# Patient Record
Sex: Female | Born: 1950 | Race: White | Hispanic: No | Smoking: Never smoker
Health system: Southern US, Community
[De-identification: ages and names within clinical notes are randomized; demographics above are authoritative.]

## PROBLEM LIST (undated history)

## (undated) DIAGNOSIS — Z9889 Other specified postprocedural states: Secondary | ICD-10-CM

## (undated) DIAGNOSIS — F32A Depression, unspecified: Secondary | ICD-10-CM

## (undated) DIAGNOSIS — M1990 Unspecified osteoarthritis, unspecified site: Secondary | ICD-10-CM

## (undated) DIAGNOSIS — Z87898 Personal history of other specified conditions: Secondary | ICD-10-CM

## (undated) DIAGNOSIS — I10 Essential (primary) hypertension: Secondary | ICD-10-CM

## (undated) DIAGNOSIS — R112 Nausea with vomiting, unspecified: Secondary | ICD-10-CM

## (undated) DIAGNOSIS — F329 Major depressive disorder, single episode, unspecified: Secondary | ICD-10-CM

## (undated) DIAGNOSIS — K219 Gastro-esophageal reflux disease without esophagitis: Secondary | ICD-10-CM

## (undated) DIAGNOSIS — E785 Hyperlipidemia, unspecified: Secondary | ICD-10-CM

## (undated) DIAGNOSIS — M48062 Spinal stenosis, lumbar region with neurogenic claudication: Secondary | ICD-10-CM

## (undated) DIAGNOSIS — F419 Anxiety disorder, unspecified: Secondary | ICD-10-CM

## (undated) HISTORY — PX: BREAST LUMPECTOMY: SHX2

## (undated) HISTORY — PX: MANDIBLE SURGERY: SHX707

## (undated) HISTORY — PX: TRIGGER FINGER RELEASE: SHX641

## (undated) HISTORY — PX: OTHER SURGICAL HISTORY: SHX169

## (undated) HISTORY — PX: OOPHORECTOMY: SHX86

## (undated) HISTORY — PX: KNEE SURGERY: SHX244

## (undated) HISTORY — PX: BREAST BIOPSY: SHX20

## (undated) HISTORY — PX: ABDOMINAL HYSTERECTOMY: SHX81

---

## 1974-03-19 HISTORY — PX: KNEE SURGERY: SHX244

## 1988-03-19 HISTORY — PX: BREAST BIOPSY: SHX20

## 2004-06-01 ENCOUNTER — Ambulatory Visit: Payer: Self-pay | Admitting: Unknown Physician Specialty

## 2004-11-13 ENCOUNTER — Ambulatory Visit: Payer: Self-pay

## 2004-12-04 ENCOUNTER — Ambulatory Visit: Payer: Self-pay | Admitting: Oncology

## 2005-06-07 ENCOUNTER — Ambulatory Visit: Payer: Self-pay | Admitting: Unknown Physician Specialty

## 2006-06-11 ENCOUNTER — Ambulatory Visit: Payer: Self-pay | Admitting: Unknown Physician Specialty

## 2007-02-12 ENCOUNTER — Ambulatory Visit: Payer: Self-pay | Admitting: Unknown Physician Specialty

## 2007-06-17 ENCOUNTER — Ambulatory Visit: Payer: Self-pay | Admitting: Unknown Physician Specialty

## 2008-07-15 ENCOUNTER — Ambulatory Visit: Payer: Self-pay | Admitting: Unknown Physician Specialty

## 2009-07-20 ENCOUNTER — Ambulatory Visit: Payer: Self-pay | Admitting: Unknown Physician Specialty

## 2010-11-24 ENCOUNTER — Ambulatory Visit: Payer: Self-pay | Admitting: Unknown Physician Specialty

## 2012-04-04 ENCOUNTER — Ambulatory Visit: Payer: Self-pay | Admitting: Unknown Physician Specialty

## 2012-05-12 ENCOUNTER — Other Ambulatory Visit: Payer: Self-pay | Admitting: Neurological Surgery

## 2012-05-16 ENCOUNTER — Encounter (HOSPITAL_COMMUNITY)
Admission: RE | Admit: 2012-05-16 | Discharge: 2012-05-16 | Disposition: A | Discharge status: Home / Self Care | Payer: BC Managed Care – PPO | Source: Ambulatory Visit | Attending: Anesthesiology | Admitting: Anesthesiology

## 2012-05-16 ENCOUNTER — Encounter (HOSPITAL_COMMUNITY)
Admission: RE | Admit: 2012-05-16 | Discharge: 2012-05-16 | Disposition: A | Discharge status: Home / Self Care | Payer: BC Managed Care – PPO | Source: Ambulatory Visit | Attending: Neurological Surgery | Admitting: Neurological Surgery

## 2012-05-16 ENCOUNTER — Encounter (HOSPITAL_COMMUNITY): Payer: Self-pay

## 2012-05-16 HISTORY — DX: Other specified postprocedural states: Z98.890

## 2012-05-16 HISTORY — DX: Hyperlipidemia, unspecified: E78.5

## 2012-05-16 HISTORY — DX: Other specified postprocedural states: R11.2

## 2012-05-16 HISTORY — DX: Unspecified osteoarthritis, unspecified site: M19.90

## 2012-05-16 HISTORY — DX: Major depressive disorder, single episode, unspecified: F32.9

## 2012-05-16 HISTORY — DX: Essential (primary) hypertension: I10

## 2012-05-16 HISTORY — DX: Personal history of other specified conditions: Z87.898

## 2012-05-16 HISTORY — DX: Depression, unspecified: F32.A

## 2012-05-16 LAB — CBC
Platelets: 185 10*3/uL (ref 150–400)
RBC: 4.75 MIL/uL (ref 3.87–5.11)
RDW: 13.1 % (ref 11.5–15.5)
WBC: 6.5 10*3/uL (ref 4.0–10.5)

## 2012-05-16 LAB — BASIC METABOLIC PANEL
Calcium: 9.6 mg/dL (ref 8.4–10.5)
Creatinine, Ser: 0.65 mg/dL (ref 0.50–1.10)
GFR calc Af Amer: >90 mL/min (ref >90)
GFR calc non Af Amer: >90 mL/min (ref >90)
Sodium: 140 mEq/L (ref 135–145)

## 2012-05-16 LAB — SURGICAL PCR SCREEN
MRSA, PCR: NEGATIVE
Staphylococcus aureus: NEGATIVE

## 2012-05-16 NOTE — Progress Notes (Signed)
Primary Physician - Dr. Bayard Males [facility] in [place] Does not have a cardiologist No recent cardiac testing

## 2012-05-16 NOTE — Pre-Procedure Instructions (Signed)
Tina Petty  05/16/2012   Your procedure is scheduled on:  Tuesday, March 4th  Report to [facility] at 0630 AM per dr. Verlee Rossetti office.  Call this number if you have problems the morning of surgery: [phone]   Remember:   Do not eat food or drink liquids after midnight.    Take these medicines the morning of surgery with A SIP OF WATER:    Do not wear jewelry, make-up or nail polish.  Do not wear lotions, powders, or perfumes,deodorant.  Do not shave 48 hours prior to surgery. Men may shave face and neck.  Do not bring valuables to the hospital.  Contacts, dentures or bridgework may not be worn into surgery.  Leave suitcase in the car. After surgery it may be brought to your room.  For patients admitted to the hospital, checkout time is 11:00 AM the day of discharge.   Patients discharged the day of surgery will not be allowed to drive home.   Special Instructions: Shower using CHG 2 nights before surgery and the night before surgery.  If you shower the day of surgery use CHG.  Use special wash - you have one bottle of CHG for all showers.  You should use approximately 1/3 of the bottle for each shower.   Please read over the following fact sheets that you were given: Pain Booklet, Coughing and Deep Breathing, MRSA Information and Surgical Site Infection Prevention

## 2012-05-19 MED ORDER — CEFAZOLIN SODIUM-DEXTROSE 2-3 GM-% IV SOLR: 2.0000 g | INTRAVENOUS | Status: DC

## 2012-05-20 ENCOUNTER — Encounter (HOSPITAL_COMMUNITY): Admission: RE | Payer: Self-pay | Source: Ambulatory Visit

## 2012-05-20 ENCOUNTER — Inpatient Hospital Stay (HOSPITAL_COMMUNITY)
Admission: RE | Admit: 2012-05-20 | Payer: BC Managed Care – PPO | Source: Ambulatory Visit | Admitting: Neurological Surgery

## 2012-05-20 ENCOUNTER — Other Ambulatory Visit: Payer: Self-pay | Admitting: Neurological Surgery

## 2012-05-20 SURGERY — LUMBAR LAMINECTOMY/DECOMPRESSION MICRODISCECTOMY 3 LEVELS
Anesthesia: General | Laterality: Bilateral

## 2012-05-20 NOTE — H&P (Addendum)
CHIEF COMPLAINT:    Pain, numbness and weakness in the lower extremities.    HISTORY OF PRESENT ILLNESS:  Tina Petty is a 62 year old individual who has been seen in this office in the past by Dr. Shirlean Kelly.  At that time, she was evaluated for lumbar spinal stenosis but she seemed to be getting by with conservative management.  She was advised regarding a possible need for an MRI scan.  Tina Petty tells me that over time she has had progressively worsening symptoms of numbness, dysesthesias and pain into the lower extremities when she would walk any distance.  She notes that it involves both lower extremities but the right seems to be more involved.  She has been seen by Dr. Yves Dill and ultimately she underwent an MRI of her lumbar spine that was performed at [facility] [place].  This study was done on 03/04/2012.  The studies are reviewed in the office and demonstrate that the patient has evidence of significant spondylitic stenosis at L2-3, 3-4, and 4-5.  The singular worst level appears to be at L4-5 where there is severe stenosis centrally and in the lateral recesses involving both the L5 nerve roots.  At L3-4, there is severe stenosis centrally also with marked hypertrophy of the facet joints.  The lateral recesses appear to be more moderately involved.  At L2-3, there is moderately severe central canal and lateral recess stenosis again with hypertrophy of the facet joints.  The patient notes she has had little in the way of back pain of any significance but she notes that the leg discomfort has caused her to give up her gym membership and she has become progressively less active physically that she would like to be.    PAST MEDICAL HISTORY:   She has some hypertension.  CURRENT MEDICATIONS:   Fish oil supplement, Calcium, D3, potassium and a baby aspirin a day.  She is on Pravastatin 40 mg. q.d., Zetia 10 mg. q.d., Amlodipine 5/20 mg. per day in combination with Benazepril, Fluoxetine 20 mg.  q.d. and Gabapentin 300 mg. 1-2 times daily.  Her previous Family History and Social History have been reviewed from her note a few years ago.  REVIEW OF SYSTEMS:   Essentially unchanged also.    PHYSICAL EXAMINATION:   On physical examination, I note that she can stand straight and erect. She has good strength in the major groups including iliopsoas, quadriceps, tibialis anterior and gastrocs.  DTR's are absent in the patellae and Achilles both.  This is even with reinforcement. The patient has evidence of preserved vibratory sensation in the distal lower extremities.  DIAGNOSTIC STUDIES:   Today in the office to further her workup, I obtained lateral flexion, extension films.  There is note make of films previously obtained by Dr. Newell Coral that she had a Grade I spondylolisthesis.  Indeed there is approximately 2-3 mm anterolisthesis of L4 on L5 noted on the current films.  However, this does not appear to be mobile through flexion and extension.    IMPRESSION:     I indicated and demonstrated the problems to the patient.  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  This occurs at L2-3, 3-4 and 4-5.   I believe that ultimately she would be best benefitted by decompressing the worst areas of the stenoses.  This would involve bilateral laminotomies and foraminotomies at L2-3, 3-4, and 4-5.  I discussed some of the different techniques that can be done to do this. I noted that  this can be done through a microendoscopic technique individually causing six separate incisions to be created with a substantial amount of dissection.  The surgery itself done this way takes a considerable length of time and requires a long anesthesia.  I believe the simplest and best approach is to do a singular midline incision and decompress this is an open fashion by doing laminotomies at L2-3, 3-4, and 4-5 bilaterally.  I noted to the patient that she does have some degenerative scoliosis that is also starting to  form and that ultimately this may not be her last back surgery.  However, given the difficulties she is having with stenosis, I believe that doing a limited decompression is all that is required at this time.  I do not believe that there is that degree of instability in the spondylolisthesis that it needs to be decompressed and stabilized.    At this time, consideration of treatment with extensive epidural steroids is not likely to yield improvement in the degree of stenosis at all and I would not recommend it.  I believe that ultimately surgical intervention for this process is what will be required. She is admitted for surgery.   Major risks of the surgery include the potential for spinal fluid leakage, infection, and nerve injury.  All of these things were discussed.  Nonetheless, given the degree of the stenosis, I believe the patient ultimately will require surgical intervention in order to get some relief from this process.

## 2012-05-21 ENCOUNTER — Encounter (HOSPITAL_COMMUNITY): Payer: Self-pay | Admitting: Pharmacy Technician

## 2012-05-29 MED ORDER — CEFAZOLIN SODIUM-DEXTROSE 2-3 GM-% IV SOLR
2.0000 g | INTRAVENOUS | Status: AC
  Administered 2012-05-30: 2 g via INTRAVENOUS
  Filled 2012-05-29: qty 50

## 2012-05-29 NOTE — H&P (Addendum)
CHIEF COMPLAINT:    Pain, numbness and weakness in the lower extremities.    HISTORY OF PRESENT ILLNESS:  Tina Petty is a 62 year old individual who has been seen in this office in the past by Dr. Shirlean Kelly.  At that time, she was evaluated for lumbar spinal stenosis but she seemed to be getting by with conservative management.  She was advised regarding a possible need for an MRI scan.  Tina Petty tells me that over time she has had progressively worsening symptoms of numbness, dysesthesias and pain into the lower extremities when she would walk any distance.  She notes that it involves both lower extremities but the right seems to be more involved.  She has been seen by Dr. Yves Dill and ultimately she underwent an MRI of her lumbar spine that was performed at [facility] [place].  This study was done on 03/04/2012.  The studies are reviewed in the office and demonstrate that the patient has evidence of significant spondylitic stenosis at L2-3, 3-4, and 4-5.  The singular worst level appears to be at L4-5 where there is severe stenosis centrally and in the lateral recesses involving both the L5 nerve roots.  At L3-4, there is severe stenosis centrally also with marked hypertrophy of the facet joints.  The lateral recesses appear to be more moderately involved.  At L2-3, there is moderately severe central canal and lateral recess stenosis again with hypertrophy of the facet joints.  The patient notes she has had little in the way of back pain of any significance but she notes that the leg discomfort has caused her to give up her gym membership and she has become progressively less active physically that she would like to be.    PAST MEDICAL HISTORY:   She has some hypertension.  CURRENT MEDICATIONS:   Fish oil supplement, Calcium, D3, potassium and a baby aspirin a day.  She is on Pravastatin 40 mg. q.d., Zetia 10 mg. q.d., Amlodipine 5/20 mg. per day in combination with Benazepril, Fluoxetine 20 mg.  q.d. and Gabapentin 300 mg. 1-2 times daily.  Her previous Family History and Social History have been reviewed from her note a few years ago.  REVIEW OF SYSTEMS:   Essentially unchanged also.    PHYSICAL EXAMINATION:   On physical examination, I note that she can stand straight and erect. She has good strength in the major groups including iliopsoas, quadriceps, tibialis anterior and gastrocs.  DTR's are absent in the patellae and Achilles both.  This is even with reinforcement. The patient has evidence of preserved vibratory sensation in the distal lower extremities.  DIAGNOSTIC STUDIES:   Today in the office to further her workup, I obtained lateral flexion, extension films.  There is note make of films previously obtained by Dr. Newell Coral that she had a Grade I spondylolisthesis.  Indeed there is approximately 2-3 mm anterolisthesis of L4 on L5 noted on the current films.  However, this does not appear to be mobile through flexion and extension.    IMPRESSION:     I indicated and demonstrated the problems to the patient.  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  This occurs at L2-3, 3-4 and 4-5.   I believe that ultimately she would be best benefitted by decompressing the worst areas of the stenoses.  This would involve bilateral laminotomies and foraminotomies at L2-3, 3-4, and 4-5.  I discussed some of the different techniques that can be done to do this. I noted that  this can be done through a microendoscopic technique individually causing six separate incisions to be created with a substantial amount of dissection.  The surgery itself done this way takes a considerable length of time and requires a long anesthesia.  I believe the simplest and best approach is to do a singular midline incision and decompress this is an open fashion by doing laminotomies at L2-3, 3-4, and 4-5 bilaterally.  I noted to the patient that she does have some degenerative scoliosis that is also starting to  form and that ultimately this may not be her last back surgery.  However, given the difficulties she is having with stenosis, I believe that doing a limited decompression is all that is required at this time.  I do not believe that there is that degree of instability in the spondylolisthesis that it needs to be decompressed and stabilized.    At this time, consideration of treatment with extensive epidural steroids is not likely to yield improvement in the degree of stenosis at all and I would not recommend it.  I believe that ultimately surgical intervention for this process is what will be required. She is admitted for surgery today.  Major risks of the surgery include the potential for spinal fluid leakage, infection, and nerve injury.  All of these things were discussed.  Nonetheless, given the degree of the stenosis, I believe the patient ultimately will require surgical intervention in order to get some relief from this process.

## 2012-05-30 ENCOUNTER — Encounter (HOSPITAL_COMMUNITY): Payer: Self-pay | Admitting: Surgery

## 2012-05-30 ENCOUNTER — Encounter (HOSPITAL_COMMUNITY): Payer: Self-pay | Admitting: Anesthesiology

## 2012-05-30 ENCOUNTER — Inpatient Hospital Stay (HOSPITAL_COMMUNITY): Payer: BC Managed Care – PPO

## 2012-05-30 ENCOUNTER — Encounter (HOSPITAL_COMMUNITY)
Admission: RE | Disposition: A | Discharge status: Home / Self Care | Payer: Self-pay | Source: Ambulatory Visit | Attending: Neurological Surgery

## 2012-05-30 ENCOUNTER — Inpatient Hospital Stay (HOSPITAL_COMMUNITY): Payer: BC Managed Care – PPO | Admitting: Anesthesiology

## 2012-05-30 ENCOUNTER — Inpatient Hospital Stay (HOSPITAL_COMMUNITY)
Admission: RE | Admit: 2012-05-30 | Discharge: 2012-06-01 | DRG: 758 | Disposition: A | Discharge status: Home / Self Care | Payer: BC Managed Care – PPO | Source: Ambulatory Visit | Attending: Neurological Surgery | Admitting: Neurological Surgery

## 2012-05-30 DIAGNOSIS — M48062 Spinal stenosis, lumbar region with neurogenic claudication: Principal | ICD-10-CM | POA: Diagnosis present

## 2012-05-30 DIAGNOSIS — Z01812 Encounter for preprocedural laboratory examination: Secondary | ICD-10-CM

## 2012-05-30 DIAGNOSIS — I10 Essential (primary) hypertension: Secondary | ICD-10-CM | POA: Diagnosis present

## 2012-05-30 DIAGNOSIS — E669 Obesity, unspecified: Secondary | ICD-10-CM | POA: Diagnosis present

## 2012-05-30 DIAGNOSIS — Z79899 Other long term (current) drug therapy: Secondary | ICD-10-CM

## 2012-05-30 DIAGNOSIS — Z01810 Encounter for preprocedural cardiovascular examination: Secondary | ICD-10-CM

## 2012-05-30 DIAGNOSIS — Z7982 Long term (current) use of aspirin: Secondary | ICD-10-CM

## 2012-05-30 DIAGNOSIS — IMO0002 Reserved for concepts with insufficient information to code with codable children: Secondary | ICD-10-CM | POA: Diagnosis present

## 2012-05-30 HISTORY — PX: LUMBAR LAMINECTOMY/DECOMPRESSION MICRODISCECTOMY: SHX5026

## 2012-05-30 SURGERY — LUMBAR LAMINECTOMY/DECOMPRESSION MICRODISCECTOMY 3 LEVELS
Anesthesia: General | Site: Back | Laterality: Bilateral | Wound class: Clean

## 2012-05-30 MED ORDER — MORPHINE SULFATE 2 MG/ML IJ SOLN: 1.0000 mg | INTRAMUSCULAR | Status: DC | PRN

## 2012-05-30 MED ORDER — POLYETHYLENE GLYCOL 3350 17 G PO PACK
17.0000 g | PACK | Freq: Every day | ORAL | Status: DC | PRN
  Filled 2012-05-30: qty 1

## 2012-05-30 MED ORDER — ALBUMIN HUMAN 5 % IV SOLN
INTRAVENOUS | Status: DC | PRN
  Administered 2012-05-30: 10:00:00 via INTRAVENOUS

## 2012-05-30 MED ORDER — THROMBIN 5000 UNITS EX SOLR
OROMUCOSAL | Status: DC | PRN
  Administered 2012-05-30: 09:00:00 via TOPICAL

## 2012-05-30 MED ORDER — DOCUSATE SODIUM 100 MG PO CAPS
100.0000 mg | ORAL_CAPSULE | Freq: Two times a day (BID) | ORAL | Status: DC
  Administered 2012-05-30 – 2012-05-31 (×3): 100 mg via ORAL
  Filled 2012-05-30 (×2): qty 1

## 2012-05-30 MED ORDER — MENTHOL 3 MG MT LOZG: 1.0000 | LOZENGE | OROMUCOSAL | Status: DC | PRN

## 2012-05-30 MED ORDER — PROPOFOL 10 MG/ML IV BOLUS
INTRAVENOUS | Status: DC | PRN
  Administered 2012-05-30: 175 mg via INTRAVENOUS

## 2012-05-30 MED ORDER — AMLODIPINE BESYLATE 5 MG PO TABS
5.0000 mg | ORAL_TABLET | Freq: Every day | ORAL | Status: DC
  Administered 2012-05-31 – 2012-06-01 (×2): 5 mg via ORAL
  Filled 2012-05-30 (×2): qty 1

## 2012-05-30 MED ORDER — ONDANSETRON HCL 4 MG/2ML IJ SOLN
4.0000 mg | Freq: Once | INTRAMUSCULAR | Status: AC | PRN
  Administered 2012-05-30: 4 mg via INTRAVENOUS

## 2012-05-30 MED ORDER — BUPIVACAINE HCL (PF) 0.5 % IJ SOLN
INTRAMUSCULAR | Status: DC | PRN
  Administered 2012-05-30: 30 mL

## 2012-05-30 MED ORDER — FLUOXETINE HCL 20 MG PO CAPS
20.0000 mg | ORAL_CAPSULE | Freq: Every day | ORAL | Status: DC
  Administered 2012-05-30 – 2012-06-01 (×3): 20 mg via ORAL
  Filled 2012-05-30 (×4): qty 1

## 2012-05-30 MED ORDER — ALUM & MAG HYDROXIDE-SIMETH 200-200-20 MG/5ML PO SUSP: 30.0000 mL | Freq: Four times a day (QID) | ORAL | Status: DC | PRN

## 2012-05-30 MED ORDER — BENAZEPRIL HCL 20 MG PO TABS
20.0000 mg | ORAL_TABLET | Freq: Every day | ORAL | Status: DC
  Administered 2012-05-31 – 2012-06-01 (×2): 20 mg via ORAL
  Filled 2012-05-30 (×2): qty 1

## 2012-05-30 MED ORDER — ACETAMINOPHEN 10 MG/ML IV SOLN: 1000.0000 mg | Freq: Once | INTRAVENOUS | Status: DC | PRN

## 2012-05-30 MED ORDER — FENTANYL CITRATE 0.05 MG/ML IJ SOLN
INTRAMUSCULAR | Status: DC | PRN
  Administered 2012-05-30 (×2): 25 ug via INTRAVENOUS
  Administered 2012-05-30 (×2): 50 ug via INTRAVENOUS
  Administered 2012-05-30: 150 ug via INTRAVENOUS
  Administered 2012-05-30: 50 ug via INTRAVENOUS

## 2012-05-30 MED ORDER — ROCURONIUM BROMIDE 100 MG/10ML IV SOLN
INTRAVENOUS | Status: DC | PRN
  Administered 2012-05-30: 50 mg via INTRAVENOUS
  Administered 2012-05-30 (×2): 10 mg via INTRAVENOUS

## 2012-05-30 MED ORDER — GLYCOPYRROLATE 0.2 MG/ML IJ SOLN
INTRAMUSCULAR | Status: DC | PRN
  Administered 2012-05-30: 0.2 mg via INTRAVENOUS
  Administered 2012-05-30: 0.6 mg via INTRAVENOUS

## 2012-05-30 MED ORDER — CEFAZOLIN SODIUM 1-5 GM-% IV SOLN
1.0000 g | Freq: Three times a day (TID) | INTRAVENOUS | Status: AC
  Administered 2012-05-30 (×2): 1 g via INTRAVENOUS
  Filled 2012-05-30 (×2): qty 50

## 2012-05-30 MED ORDER — METOCLOPRAMIDE HCL 5 MG/ML IJ SOLN
INTRAMUSCULAR | Status: AC
  Administered 2012-05-30: 10 mg via INTRAVENOUS
  Filled 2012-05-30: qty 2

## 2012-05-30 MED ORDER — ACETAMINOPHEN 650 MG RE SUPP: 650.0000 mg | RECTAL | Status: DC | PRN

## 2012-05-30 MED ORDER — LIDOCAINE-EPINEPHRINE 1 %-1:100000 IJ SOLN
INTRAMUSCULAR | Status: DC | PRN
  Administered 2012-05-30: 20 mL

## 2012-05-30 MED ORDER — FLEET ENEMA 7-19 GM/118ML RE ENEM: 1.0000 | ENEMA | Freq: Once | RECTAL | Status: AC | PRN

## 2012-05-30 MED ORDER — SODIUM CHLORIDE 0.9 % IV SOLN: 250.0000 mL | INTRAVENOUS | Status: DC

## 2012-05-30 MED ORDER — SODIUM CHLORIDE 0.9 % IV SOLN
INTRAVENOUS | Status: AC
  Administered 2012-05-30 (×2): via INTRAVENOUS

## 2012-05-30 MED ORDER — MIDAZOLAM HCL 5 MG/5ML IJ SOLN
INTRAMUSCULAR | Status: DC | PRN
  Administered 2012-05-30 (×2): 1 mg via INTRAVENOUS

## 2012-05-30 MED ORDER — SODIUM CHLORIDE 0.9 % IJ SOLN: 3.0000 mL | INTRAMUSCULAR | Status: DC | PRN

## 2012-05-30 MED ORDER — METHOCARBAMOL 100 MG/ML IJ SOLN: 500.0000 mg | Freq: Four times a day (QID) | INTRAVENOUS | Status: DC | PRN

## 2012-05-30 MED ORDER — SODIUM CHLORIDE 0.9 % IV SOLN
10.0000 mg | INTRAVENOUS | Status: DC | PRN
  Administered 2012-05-30: 5 ug/min via INTRAVENOUS

## 2012-05-30 MED ORDER — NEOSTIGMINE METHYLSULFATE 1 MG/ML IJ SOLN
INTRAMUSCULAR | Status: DC | PRN
  Administered 2012-05-30: 4 mg via INTRAVENOUS

## 2012-05-30 MED ORDER — BACITRACIN 50000 UNITS IM SOLR
INTRAMUSCULAR | Status: AC
  Filled 2012-05-30: qty 1

## 2012-05-30 MED ORDER — ONDANSETRON HCL 4 MG/2ML IJ SOLN
INTRAMUSCULAR | Status: DC | PRN
  Administered 2012-05-30: 4 mg via INTRAVENOUS

## 2012-05-30 MED ORDER — PHENOL 1.4 % MT LIQD: 1.0000 | OROMUCOSAL | Status: DC | PRN

## 2012-05-30 MED ORDER — KETOROLAC TROMETHAMINE 30 MG/ML IJ SOLN
INTRAMUSCULAR | Status: AC
  Administered 2012-05-30: 15 mg
  Filled 2012-05-30: qty 1

## 2012-05-30 MED ORDER — SODIUM CHLORIDE 0.9 % IJ SOLN
3.0000 mL | Freq: Two times a day (BID) | INTRAMUSCULAR | Status: DC
  Administered 2012-05-31: 3 mL via INTRAVENOUS

## 2012-05-30 MED ORDER — HYDROMORPHONE HCL PF 1 MG/ML IJ SOLN
INTRAMUSCULAR | Status: AC
  Administered 2012-05-30: 0.5 mg via INTRAVENOUS
  Filled 2012-05-30: qty 1

## 2012-05-30 MED ORDER — DEXAMETHASONE SODIUM PHOSPHATE 4 MG/ML IJ SOLN
INTRAMUSCULAR | Status: DC | PRN
  Administered 2012-05-30: 8 mg via INTRAVENOUS

## 2012-05-30 MED ORDER — LIDOCAINE HCL (CARDIAC) 20 MG/ML IV SOLN
INTRAVENOUS | Status: DC | PRN
  Administered 2012-05-30: 60 mg via INTRAVENOUS

## 2012-05-30 MED ORDER — ARTIFICIAL TEARS OP OINT
TOPICAL_OINTMENT | OPHTHALMIC | Status: DC | PRN
  Administered 2012-05-30: 1 via OPHTHALMIC

## 2012-05-30 MED ORDER — LACTATED RINGERS IV SOLN
INTRAVENOUS | Status: DC | PRN
  Administered 2012-05-30 (×2): via INTRAVENOUS

## 2012-05-30 MED ORDER — GABAPENTIN 300 MG PO CAPS
300.0000 mg | ORAL_CAPSULE | Freq: Two times a day (BID) | ORAL | Status: DC
  Administered 2012-05-30 – 2012-06-01 (×5): 300 mg via ORAL
  Filled 2012-05-30 (×7): qty 1

## 2012-05-30 MED ORDER — ONDANSETRON HCL 4 MG/2ML IJ SOLN: 4.0000 mg | INTRAMUSCULAR | Status: DC | PRN

## 2012-05-30 MED ORDER — OXYCODONE-ACETAMINOPHEN 5-325 MG PO TABS
1.0000 | ORAL_TABLET | ORAL | Status: DC | PRN
  Administered 2012-05-30: 1 via ORAL
  Administered 2012-05-31: 2 via ORAL
  Administered 2012-06-01: 1 via ORAL
  Administered 2012-06-01: 2 via ORAL
  Filled 2012-05-30 (×2): qty 2
  Filled 2012-05-30: qty 1
  Filled 2012-05-30: qty 2

## 2012-05-30 MED ORDER — ACETAMINOPHEN 325 MG PO TABS: 650.0000 mg | ORAL_TABLET | ORAL | Status: DC | PRN

## 2012-05-30 MED ORDER — SODIUM CHLORIDE 0.9 % IV SOLN
INTRAVENOUS | Status: AC
  Filled 2012-05-30: qty 500

## 2012-05-30 MED ORDER — HYDROMORPHONE HCL PF 1 MG/ML IJ SOLN: 0.2500 mg | INTRAMUSCULAR | Status: DC | PRN

## 2012-05-30 MED ORDER — BISACODYL 10 MG RE SUPP: 10.0000 mg | Freq: Every day | RECTAL | Status: DC | PRN

## 2012-05-30 MED ORDER — AMLODIPINE BESY-BENAZEPRIL HCL 5-20 MG PO CAPS: 1.0000 | ORAL_CAPSULE | Freq: Every day | ORAL | Status: DC

## 2012-05-30 MED ORDER — 0.9 % SODIUM CHLORIDE (POUR BTL) OPTIME
TOPICAL | Status: DC | PRN
  Administered 2012-05-30: 1000 mL

## 2012-05-30 MED ORDER — KETOROLAC TROMETHAMINE 15 MG/ML IJ SOLN
15.0000 mg | Freq: Three times a day (TID) | INTRAMUSCULAR | Status: AC
  Administered 2012-05-30 – 2012-05-31 (×4): 15 mg via INTRAVENOUS
  Filled 2012-05-30 (×6): qty 1

## 2012-05-30 MED ORDER — POTASSIUM GLUCONATE 595 MG PO CAPS: 1.0000 | ORAL_CAPSULE | Freq: Every day | ORAL | Status: DC

## 2012-05-30 MED ORDER — LIDOCAINE HCL 4 % MT SOLN
OROMUCOSAL | Status: DC | PRN
  Administered 2012-05-30: 4 mL via TOPICAL

## 2012-05-30 MED ORDER — PHENYLEPHRINE HCL 10 MG/ML IJ SOLN
INTRAMUSCULAR | Status: DC | PRN
  Administered 2012-05-30: 80 ug via INTRAVENOUS

## 2012-05-30 MED ORDER — EPHEDRINE SULFATE 50 MG/ML IJ SOLN
INTRAMUSCULAR | Status: DC | PRN
  Administered 2012-05-30: 10 mg via INTRAVENOUS
  Administered 2012-05-30: 5 mg via INTRAVENOUS
  Administered 2012-05-30 (×2): 10 mg via INTRAVENOUS

## 2012-05-30 MED ORDER — METOCLOPRAMIDE HCL 5 MG/ML IJ SOLN: 10.0000 mg | Freq: Once | INTRAMUSCULAR | Status: AC

## 2012-05-30 MED ORDER — SODIUM CHLORIDE 0.9 % IR SOLN
Status: DC | PRN
  Administered 2012-05-30: 08:00:00

## 2012-05-30 MED ORDER — SENNA 8.6 MG PO TABS
1.0000 | ORAL_TABLET | Freq: Two times a day (BID) | ORAL | Status: DC
  Administered 2012-05-30 – 2012-05-31 (×2): 8.6 mg via ORAL
  Filled 2012-05-30 (×4): qty 1

## 2012-05-30 MED ORDER — METHOCARBAMOL 500 MG PO TABS
500.0000 mg | ORAL_TABLET | Freq: Four times a day (QID) | ORAL | Status: DC | PRN
  Administered 2012-05-30 – 2012-06-01 (×3): 500 mg via ORAL
  Filled 2012-05-30 (×4): qty 1

## 2012-05-30 MED ORDER — ONDANSETRON HCL 4 MG/2ML IJ SOLN
INTRAMUSCULAR | Status: AC
  Filled 2012-05-30: qty 2

## 2012-05-30 SURGICAL SUPPLY — 57 items
ADH SKN CLS APL DERMABOND .7 (GAUZE/BANDAGES/DRESSINGS) ×1
ADH SKN CLS LQ APL DERMABOND (GAUZE/BANDAGES/DRESSINGS) ×1
BAG DECANTER FOR FLEXI CONT (MISCELLANEOUS) ×2 IMPLANT
BLADE SURG ROTATE 9660 (MISCELLANEOUS) IMPLANT
BUR ACORN 6.0 (BURR) IMPLANT
BUR MATCHSTICK NEURO 3.0 LAGG (BURR) ×2 IMPLANT
CANISTER SUCTION 2500CC (MISCELLANEOUS) ×2 IMPLANT
CLOTH BEACON ORANGE TIMEOUT ST (SAFETY) ×2 IMPLANT
CONT SPEC 4OZ CLIKSEAL STRL BL (MISCELLANEOUS) ×2 IMPLANT
DECANTER SPIKE VIAL GLASS SM (MISCELLANEOUS) ×2 IMPLANT
DERMABOND ADHESIVE PROPEN (GAUZE/BANDAGES/DRESSINGS) ×1
DERMABOND ADVANCED (GAUZE/BANDAGES/DRESSINGS) ×1
DERMABOND ADVANCED .7 DNX12 (GAUZE/BANDAGES/DRESSINGS) ×1 IMPLANT
DERMABOND ADVANCED .7 DNX6 (GAUZE/BANDAGES/DRESSINGS) IMPLANT
DRAPE LAPAROTOMY 100X72X124 (DRAPES) ×2 IMPLANT
DRAPE MICROSCOPE LEICA (MISCELLANEOUS) IMPLANT
DRAPE POUCH INSTRU U-SHP 10X18 (DRAPES) ×2 IMPLANT
DRAPE PROXIMA HALF (DRAPES) IMPLANT
DURAPREP 26ML APPLICATOR (WOUND CARE) ×2 IMPLANT
ELECT REM PT RETURN 9FT ADLT (ELECTROSURGICAL) ×2
ELECTRODE REM PT RTRN 9FT ADLT (ELECTROSURGICAL) ×1 IMPLANT
GAUZE SPONGE 4X4 16PLY XRAY LF (GAUZE/BANDAGES/DRESSINGS) IMPLANT
GLOVE BIOGEL PI IND STRL 7.0 (GLOVE) IMPLANT
GLOVE BIOGEL PI IND STRL 8.5 (GLOVE) ×1 IMPLANT
GLOVE BIOGEL PI INDICATOR 7.0 (GLOVE) ×3
GLOVE BIOGEL PI INDICATOR 8.5 (GLOVE) ×1
GLOVE ECLIPSE 7.5 STRL STRAW (GLOVE) ×1 IMPLANT
GLOVE ECLIPSE 8.5 STRL (GLOVE) ×2 IMPLANT
GLOVE EXAM NITRILE LRG STRL (GLOVE) IMPLANT
GLOVE EXAM NITRILE MD LF STRL (GLOVE) IMPLANT
GLOVE EXAM NITRILE XL STR (GLOVE) IMPLANT
GLOVE EXAM NITRILE XS STR PU (GLOVE) IMPLANT
GLOVE INDICATOR 7.0 STRL GRN (GLOVE) ×1 IMPLANT
GOWN BRE IMP SLV AUR LG STRL (GOWN DISPOSABLE) IMPLANT
GOWN BRE IMP SLV AUR XL STRL (GOWN DISPOSABLE) IMPLANT
GOWN STRL REIN 2XL LVL4 (GOWN DISPOSABLE) ×2 IMPLANT
HEMOSTAT POWDER SURGIFOAM 1G (HEMOSTASIS) ×1 IMPLANT
KIT BASIN OR (CUSTOM PROCEDURE TRAY) ×2 IMPLANT
KIT ROOM TURNOVER OR (KITS) ×2 IMPLANT
NDL SPNL 20GX3.5 QUINCKE YW (NEEDLE) IMPLANT
NEEDLE HYPO 22GX1.5 SAFETY (NEEDLE) ×2 IMPLANT
NEEDLE SPNL 20GX3.5 QUINCKE YW (NEEDLE) IMPLANT
NS IRRIG 1000ML POUR BTL (IV SOLUTION) ×2 IMPLANT
PACK LAMINECTOMY NEURO (CUSTOM PROCEDURE TRAY) ×2 IMPLANT
PAD ARMBOARD 7.5X6 YLW CONV (MISCELLANEOUS) ×6 IMPLANT
PATTIES SURGICAL .5 X1 (DISPOSABLE) ×2 IMPLANT
RUBBERBAND STERILE (MISCELLANEOUS) IMPLANT
SPONGE GAUZE 4X4 12PLY (GAUZE/BANDAGES/DRESSINGS) ×2 IMPLANT
SPONGE SURGIFOAM ABS GEL SZ50 (HEMOSTASIS) ×2 IMPLANT
SUT VIC AB 1 CT1 18XBRD ANBCTR (SUTURE) ×1 IMPLANT
SUT VIC AB 1 CT1 8-18 (SUTURE) ×2
SUT VIC AB 2-0 CP2 18 (SUTURE) ×2 IMPLANT
SUT VIC AB 3-0 SH 8-18 (SUTURE) ×2 IMPLANT
SYR 20ML ECCENTRIC (SYRINGE) ×2 IMPLANT
TOWEL OR 17X24 6PK STRL BLUE (TOWEL DISPOSABLE) ×2 IMPLANT
TOWEL OR 17X26 10 PK STRL BLUE (TOWEL DISPOSABLE) ×2 IMPLANT
WATER STERILE IRR 1000ML POUR (IV SOLUTION) ×2 IMPLANT

## 2012-05-30 NOTE — Anesthesia Procedure Notes (Signed)
Procedure Name: Intubation Date/Time: 05/30/2012 7:50 AM Performed by: Gayla Medicus Pre-anesthesia Checklist: Patient identified, Timeout performed, Emergency Drugs available, Suction available and Patient being monitored Patient Re-evaluated:Patient Re-evaluated prior to inductionOxygen Delivery Method: Circle system utilized Preoxygenation: Pre-oxygenation with 100% oxygen Intubation Type: IV induction Ventilation: Oral airway inserted - appropriate to patient size and Mask ventilation without difficulty Laryngoscope Size: Mac and 3 Grade View: Grade II Tube type: Oral Tube size: 7.5 mm Number of attempts: 1 Airway Equipment and Method: Stylet and LTA kit utilized Placement Confirmation: ETT inserted through vocal cords under direct vision,  positive ETCO2 and breath sounds checked- equal and bilateral Secured at: 22 cm Tube secured with: Tape Dental Injury: Teeth and Oropharynx as per pre-operative assessment

## 2012-05-30 NOTE — Clinical Social Work Note (Signed)
Clinical Social Work   CSW received a consult for SNF. CSW reviewed chart. Awaiting PT/OT evals for discharge recommendations. Evals are needed for prior auth for SNF placement. CSW will assess for SNF, if appropriate. CSW will continue to follow.   Dede Query, MSW, Theresia Majors [phone]

## 2012-05-30 NOTE — Progress Notes (Signed)
Subjective: Patient reports Patient feels reasonably comfortable. Had some nausea earlier legs feel strong  Objective: Vital signs in last 24 hours: Temp:  [96.9 F (36.1 C)-98.6 F (37 C)] 97.2 F (36.2 C) (03/14 1320) Pulse Rate:  [66-101] 68 (03/14 1320) Resp:  [12-20] 20 (03/14 1320) BP: (107-183)/(32-81) 107/32 mmHg (03/14 1320) SpO2:  [94 %-96 %] 94 % (03/14 1320)  Intake/Output from previous day:   Intake/Output this shift: Total I/O In: 1850 [I.V.:1600; IV Piggyback:250] Out: 400 [Blood:400]  Dressing clean dry and intact. Motor function good and iliopsoas quad tibialis anterior and gastrocs.  Lab Results: No results found for this basename: WBC, HGB, HCT, PLT,  in the last 72 hours BMET No results found for this basename: NA, K, CL, CO2, GLUCOSE, BUN, CREATININE, CALCIUM,  in the last 72 hours  Studies/Results: Dg Lumbar Spine 1 View  05/30/2012  *RADIOLOGY REPORT*  Clinical Data: Lumbar disc disease.  LUMBAR SPINE - 1 VIEW  Comparison: Radiographs dated 05/07/2012  Findings: Radiograph demonstrates instruments on the spinous processes of L2 and L3.  IMPRESSION: Instruments at L2-3.   Original Report Authenticated By: Francene Boyers, M.D.     Assessment/Plan: Stable postop.  LOS: 0 days  We'll see outpatient tolerates ambulation plan discharge either tomorrow or Sunday.   ELSNER,HENRY J 05/30/2012, 5:37 PM

## 2012-05-30 NOTE — Anesthesia Postprocedure Evaluation (Signed)
  Anesthesia Post-op Note  Patient: Tina Petty  Procedure(s) Performed: Procedure(s) with comments: LUMBAR LAMINECTOMY/DECOMPRESSION MICRODISCECTOMY 3 LEVELS (Bilateral) - Bilateral Lumbar two-three,three-four,four-five Laminectomy/Foraminotomy  Patient Location: PACU  Anesthesia Type:General  Level of Consciousness: awake, alert  and oriented  Airway and Oxygen Therapy: Patient Spontanous Breathing  Post-op Pain: none  Post-op Assessment: Post-op Vital signs reviewed, Respiratory Function Stable, Patent Airway and Pain level controlled  Post-op Vital Signs: stable  Complications: No apparent anesthesia complications

## 2012-05-30 NOTE — Anesthesia Preprocedure Evaluation (Addendum)
Anesthesia Evaluation  Patient identified by MRN, date of birth, ID band Patient awake    Reviewed: Allergy & Precautions, H&P , NPO status , Patient's Chart, lab work & pertinent test results  Airway Mallampati: II      Dental  (+) Teeth Intact and Dental Advisory Given   Pulmonary  breath sounds clear to auscultation        Cardiovascular hypertension, Pt. on medications Rhythm:Regular Rate:Normal     Neuro/Psych    GI/Hepatic   Endo/Other    Renal/GU      Musculoskeletal   Abdominal (+) + obese,  Abdomen: soft.    Peds  Hematology   Anesthesia Other Findings   Reproductive/Obstetrics                          Anesthesia Physical Anesthesia Plan  ASA: III  Anesthesia Plan: General   Post-op Pain Management:    Induction: Intravenous  Airway Management Planned: Oral ETT  Additional Equipment:   Intra-op Plan:   Post-operative Plan: Extubation in OR  Informed Consent: I have reviewed the patients History and Physical, chart, labs and discussed the procedure including the risks, benefits and alternatives for the proposed anesthesia with the patient or authorized representative who has indicated his/her understanding and acceptance.   Dental advisory given  Plan Discussed with: CRNA and Surgeon  Anesthesia Plan Comments: (Lumbar Spondylosis Htn Obesity H/O Post-op Nausea and Vomiting  Plan GA with oral ETT  Kipp Brood, MD)        Anesthesia Quick Evaluation

## 2012-05-30 NOTE — Preoperative (Signed)
Beta Blockers   Reason not to administer Beta Blockers: not prescribed 

## 2012-05-30 NOTE — Transfer of Care (Signed)
Immediate Anesthesia Transfer of Care Note  Patient: Tina Petty  Procedure(s) Performed: Procedure(s) with comments: LUMBAR LAMINECTOMY/DECOMPRESSION MICRODISCECTOMY 3 LEVELS (Bilateral) - Bilateral Lumbar two-three,three-four,four-five Laminectomy/Foraminotomy  Patient Location: PACU  Anesthesia Type:General  Level of Consciousness: awake, alert  and oriented  Airway & Oxygen Therapy: Patient Spontanous Breathing and Patient connected to nasal cannula oxygen  Post-op Assessment: Report given to PACU RN, Post -op Vital signs reviewed and stable and Patient moving all extremities X 4  Post vital signs: Reviewed and stable  Complications: No apparent anesthesia complications

## 2012-05-30 NOTE — Progress Notes (Signed)
PHARMACIST - PHYSICIAN ORDER COMMUNICATION  CONCERNING: P&T Medication Policy on Herbal Medications  DESCRIPTION:  This patient's order for:  Potassium Gluconate  has been noted.  This product(s) is classified as an "herbal" or natural product. Due to a lack of definitive safety studies or FDA approval, nonstandard manufacturing practices, plus the potential risk of unknown drug-drug interactions while on inpatient medications, the Pharmacy and Therapeutics Committee does not permit the use of "herbal" or natural products of this type within [facility].   ACTION TAKEN: The pharmacy department is unable to verify this order at this time and your patient has been informed of this safety policy. Please reevaluate patient's clinical condition at discharge and address if the herbal or natural product(s) should be resumed at that time.   **This is an OTC version of a potassium supplement, equivalent to only 2.5 mEq of potassium.  Please consider ordering K-Dur if potassium supplementation necessary.  Marisue Humble, PharmD Clinical Pharmacist [facility]

## 2012-05-30 NOTE — Op Note (Signed)
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 Procedure: Bilateral laminotomies L2-3 L3-4 L4-5 with decompression of L3,L4 and L5 nerve roots bilaterally, microdissection technique with operating microscope. Surgeon: Barnett Abu Assistant: Sharyon Cable Anesthesia: Gen. endotracheal Indications: The patient is a 62 year old individual who's had significant back and bilateral lower extremity pain with decreased stamina in her ability to walk even short distances. An MRI was performed which demonstrates the presence of severe spinal stenosis at L3-4 and L4-5 moderately severe stenosis at L2-3. Having failed efforts at conservative management for a couple of years now and feeling increasing limitation in her activities of daily living she's been advised regarding surgical decompression at each of these 3 levels.  Procedure: The patient was brought to the operating room supine on a stretcher. After the smooth induction of general endotracheal anesthesia she was carefully turned prone the back being exposed shaved prepped with alcohol and DuraPrep and then draped in a sterile fashion. A midline incision was created in the lumbar region and this was carried down to the lumbar dorsal fascia. The fascia was opened on either side of the midline to identify to adjacent spinous processes and these were identified as L3 and L4. Then the dissection was carried out to expose the superior portion of L5. The interlaminar spaces at L2-3 L3-4 and L4-5 were then identified and isolated. Significant exophytic growth on the facet joints was removed. Laminotomies were then created first at the level of L3-L4 this was done with a high-speed drill and a 3 and 4 mm Kerrison punch to remove significant bone and redundant  ligamentous material. Procedure was started first on the right side. As the dura was exposed the dissection was carried cephalad and inferiorly. Dissection was also carried laterally removing portions of the medial portion of the facet at the L3-L4 level. When a good decompression of the central canal is identified the L4 nerve root was sounded inferiorly and this was found to be stenotic foraminotomies was carried out over the nerve root to decompress it and lateral recess. Superiorly the path of the L3 nerve root was sounded and a laminotomy was carried cephalad to expose the foramen for the L3 nerve root from below. This was decompressed. Hemostasis was achieved in this area and attention was turned to the optic side were similar laminotomy and foraminotomies for the inferior L4 nerve root and the superior L3 nerve root was carried out.  Attention was then turned to L4-L5 here a laminotomy was carried out removing both a significant amount of the superior laminar arch and medial facet and the inferior laminar arch of L5 at its superior aspect severe stenosis was particularly encountered on the left L5 nerve root and this was decompressed using a combination of 2 and 3 mm punches and a high-speed drill. The superior foramen for the L4 nerve root was similarly decompressed this aperture. On the right side significant stenosis was found for the central canal and the L5 nerve root inferiorly and the L4 nerve root superiorly this was decompressed using a combination of punches and the drill. This portion of the surgery was done under the microscope with the operating microscope being used for greater magnification and to allow a gentle her dissection on the common dural tube and the nerve roots. Procedure was also done with the help of Dr. Gerlene Fee to identify  and isolate the individual nerve roots in nature they were well decompressed area  Finally at L2-3 laminotomies were created to relieve central canal  stenosis mainly at the L3 nerve roots were also decompressed and explored from sit relieving them of significant stenosis as it entered the foramen inferiorly. Once this was accomplished hemostasis at each level was checked carefully with the bipolar cautery some small pledgets of Gelfoam soaked thrombin and Surgifoam spray was used around the surgical sites. This was later irrigated away. Closure of the wound was performed with #1 Vicryl in interrupted fashion 2-0 Vicryl was used in the subcutaneous tissues 20 cc of half percent Marcaine was injected into the paraspinous musculature and fascia. Then the final skin layer was closed with 3-0 Vicryl in a subcuticular fashion. Blood loss for the entire procedure was estimated at 500 cc.

## 2012-05-31 ENCOUNTER — Encounter (HOSPITAL_COMMUNITY): Payer: Self-pay | Admitting: Neurological Surgery

## 2012-05-31 NOTE — Evaluation (Signed)
Physical Therapy Evaluation Patient Details Name: Tina Petty MRN: [id number] DOB: 04-May-1950 Today's Date: 05/31/2012 Time: 4782-9562 PT Time Calculation (min): 28 min  PT Assessment / Plan / Recommendation Clinical Impression  Pt is 62 yo female s/p laminectomy/decompression/microdisectomy x 3  levels mobilizing very well. Anticipate pt to be safe for d/c home when medically approved. Pt with good home set up, support and recommended DME.    PT Assessment  Patient needs continued PT services    Follow Up Recommendations  No PT follow up;Supervision/Assistance - 24 hour    Does the patient have the potential to tolerate intense rehabilitation      Barriers to Discharge None      Equipment Recommendations  None recommended by PT (pt has equip)    Recommendations for Other Services     Frequency Min 5X/week    Precautions / Restrictions Precautions Precautions: Back Precaution Booklet Issued: Yes (comment) Precaution Comments: pt with good verbal understanding Restrictions Weight Bearing Restrictions: No   Pertinent Vitals/Pain 3-4/10 back pain      Mobility  Bed Mobility Bed Mobility: Rolling Left;Left Sidelying to Sit;Sit to Sidelying Left Rolling Left: 5: Supervision Left Sidelying to Sit: 5: Supervision;HOB flat Sit to Sidelying Left: 5: Supervision;HOB flat Details for Bed Mobility Assistance: directional v/c's for log roll technique Transfers Transfers: Sit to Stand;Stand to Sit Sit to Stand: 5: Supervision;With upper extremity assist;With armrests Stand to Sit: 5: Supervision;With upper extremity assist;With armrests;To chair/3-in-1 Details for Transfer Assistance: v/c's for hand placement Ambulation/Gait Ambulation/Gait Assistance: 4: Min guard Ambulation Distance (Feet): 150 Feet Assistive device: Rolling walker Ambulation/Gait Assistance Details: pt with good posture, v/c's to decrease UE wbing Gait Pattern: Within Functional Limits;Step-through  pattern;Decreased stride length Gait velocity: decr General Gait Details: g Stairs: No (simulated and discussed but did not complete)    Exercises     PT Diagnosis: Difficulty walking  PT Problem List: Decreased activity tolerance;Decreased balance;Decreased mobility PT Treatment Interventions: DME instruction;Gait training;Therapeutic exercise;Functional mobility training;Therapeutic activities;Stair training   PT Goals Acute Rehab PT Goals PT Goal Formulation: With patient Time For Goal Achievement: 06/07/12 Potential to Achieve Goals: Good Pt will Roll Supine to Left Side: with modified independence PT Goal: Rolling Supine to Left Side - Progress: Goal set today Pt will go Sit to Supine/Side: with modified independence PT Goal: Sit to Supine/Side - Progress: Goal set today Pt will go Sit to Stand: with modified independence;with upper extremity assist (up to RW) PT Goal: Sit to Stand - Progress: Goal set today Pt will Ambulate: >150 feet;with modified independence;with least restrictive assistive device PT Goal: Ambulate - Progress: Goal set today Pt will Go Up / Down Stairs: 1-2 stairs;with supervision;with rolling walker PT Goal: Up/Down Stairs - Progress: Goal set today Additional Goals Additional Goal #1: Pt independent with recall of 3/3 back prec. and 100% compliance PT Goal: Additional Goal #1 - Progress: Goal set today  Visit Information  Last PT Received On: 06/07/12 Assistance Needed: +1    Subjective Data  Subjective: pt received sitting up in chair with report of 3/10 pain. Patient Stated Goal: home   Prior Functioning  Home Living Lives With: Alone Available Help at Discharge: Family;Friend(s);Available 24 hours/day (sons and co-workers can provide 24/7) Type of Home: House Home Access: Stairs to enter [employer] of Steps: 1 (platform step) Entrance Stairs-Rails: None Home Layout: One level Bathroom Shower/Tub: Tub/shower unit;Door [employer]  Toilet: Handicapped height Bathroom Accessibility: Yes How Accessible: Accessible via walker Home Adaptive Equipment: Dan Humphreys -  rolling;Shower chair with back;Bedside commode/3-in-1 Prior Function Level of Independence: Independent Able to Take Stairs?: Yes Driving: Yes Communication Communication: No difficulties Dominant Hand: Right    Cognition  Cognition Overall Cognitive Status: Appears within functional limits for tasks assessed/performed Arousal/Alertness: Awake/alert Orientation Level: Oriented X4 / Intact Behavior During Session: [facility] for tasks performed    Extremity/Trunk Assessment Right Upper Extremity Assessment RUE ROM/Strength/Tone: [facility] for tasks assessed Left Upper Extremity Assessment LUE ROM/Strength/Tone: [facility] for tasks assessed Right Lower Extremity Assessment RLE ROM/Strength/Tone: [facility] for tasks assessed Left Lower Extremity Assessment LLE ROM/Strength/Tone: WFL for tasks assessed Trunk Assessment Trunk Assessment: Normal   Balance    End of Session PT - End of Session Equipment Utilized During Treatment: Gait belt Activity Tolerance: Patient tolerated treatment well Patient left: in chair;with call bell/phone within reach Nurse Communication: Mobility status  GP     Marcene Brawn 05/31/2012, 10:05 AM  Lewis Shock, PT, DPT Pager #: [phone] Office #: [phone]

## 2012-05-31 NOTE — Progress Notes (Signed)
Patient ID: Tina Petty, female   DOB: 03/29/1950, 62 y.o.   MRN: [id number] Doing great, minimal incisional pain, no weakness. Ambulating since this am. Dc in am

## 2012-05-31 NOTE — Clinical Social Work Note (Signed)
Clinical Social Worker reviewed chart and noticed PT does not recommend any follow up. CSW will sign off, as social work intervention is no longer needed.   Rozetta Nunnery MSW, [employer] (Weekend Coverage) [phone]

## 2012-05-31 NOTE — Evaluation (Signed)
Occupational Therapy Evaluation Patient Details Name: Tina Petty MRN: [id number] DOB: Apr 01, 1950 Today's Date: 05/31/2012 Time: 4098-1191 OT Time Calculation (min): 27 min  OT Assessment / Plan / Recommendation Clinical Impression    Pt is 62 yo female s/p laminectomy/decompression/microdisectomy x 3 levels.  Currently, pt is supervision with all ADLs.  All education completed.  No further OT recommended. Will sign off.     OT Assessment  Patient does not need any further OT services    Follow Up Recommendations  No OT follow up    Barriers to Discharge      Equipment Recommendations  None recommended by OT    Recommendations for Other Services    Frequency       Precautions / Restrictions Precautions Precautions: Back Precaution Booklet Issued: Yes (comment) Precaution Comments: pt with good verbal understanding Restrictions Weight Bearing Restrictions: No       ADL  Eating/Feeding: Independent Where Assessed - Eating/Feeding: Chair Grooming: Wash/dry hands;Supervision/safety Where Assessed - Grooming: Supported standing Upper Body Bathing: Set up Where Assessed - Upper Body Bathing: Unsupported sitting Lower Body Bathing: Supervision/safety Where Assessed - Lower Body Bathing: Supported sit to stand Upper Body Dressing: Supervision/safety;Set up Where Assessed - Upper Body Dressing: Unsupported sitting Lower Body Dressing: Supervision/safety Where Assessed - Lower Body Dressing: Supported sit to stand Toilet Transfer: Supervision/safety Statistician Method: Sit to Barista: Comfort height toilet Toileting - Architect and Hygiene: Supervision/safety Where Assessed - Engineer, mining and Hygiene: Sit to stand from 3-in-1 or toilet Tub/Shower Transfer: Insurance risk surveyor Method: Ambulating Equipment Used: Back brace;Rolling walker Transfers/Ambulation Related to ADLs: ambulates with  supervision ADL Comments: Pt able to access feet by crossing ankles over knees (Demonstrates good awareness of back precautions)    OT Diagnosis:    OT Problem List:   OT Treatment Interventions:     OT Goals    Visit Information  Last OT Received On: 05/31/12 Assistance Needed: +1    Subjective Data  Subjective: "I read through those handouts" Patient Stated Goal: To regain independence without pain   Prior Functioning     Home Living Lives With: Alone Available Help at Discharge: Family;Friend(s);Available 24 hours/day (sons and co-workers can provide 24/7) Type of Home: House Home Access: Stairs to enter [employer] of Steps: 1 (platform step) Entrance Stairs-Rails: None Home Layout: One level Bathroom Shower/Tub: Tub/shower unit;Door [employer] Toilet: Handicapped height Bathroom Accessibility: Yes How Accessible: Accessible via walker Home Adaptive Equipment: Walker - rolling;Shower chair with back;Bedside commode/3-in-1;Reacher Prior Function Level of Independence: Independent Able to Take Stairs?: Yes Driving: Yes Vocation: Retired Musician: No difficulties Dominant Hand: Right         Vision/Perception     Copywriter, advertising Overall Cognitive Status: Appears within functional limits for tasks assessed/performed Arousal/Alertness: Awake/alert Orientation Level: Oriented X4 / Intact Behavior During Session: WFL for tasks performed    Extremity/Trunk Assessment Right Upper Extremity Assessment RUE ROM/Strength/Tone: WFL for tasks assessed RUE Sensation: WFL - Light Touch RUE Coordination: WFL - gross/fine motor Left Upper Extremity Assessment LUE ROM/Strength/Tone: WFL for tasks assessed LUE Sensation: WFL - Light Touch LUE Coordination: WFL - gross/fine motor Trunk Assessment Trunk Assessment: Normal     Mobility Bed Mobility Bed Mobility: Rolling Left;Left Sidelying to Sit;Sitting - Scoot to [employer] of Bed;Sit to  Sidelying Right Rolling Left: 5: Supervision Left Sidelying to Sit: 5: Supervision;HOB flat Sitting - Scoot to Edge of Bed: 5: Supervision Sit to Sidelying Right:  5: Supervision;HOB flat Details for Bed Mobility Assistance: Pt demonstrates good awareness of back precautions Transfers Transfers: Sit to Stand;Stand to Sit Sit to Stand: 5: Supervision;With upper extremity assist;With armrests Stand to Sit: 5: Supervision;With upper extremity assist     Exercise     Balance     End of Session OT - End of Session Equipment Utilized During Treatment: Back brace Activity Tolerance: Patient tolerated treatment well Patient left: in bed;with call bell/phone within reach  GO     Reina Wilton M 05/31/2012, 6:05 PM

## 2012-06-01 MED ORDER — OXYCODONE-ACETAMINOPHEN 5-325 MG PO TABS: 1.0000 | ORAL_TABLET | ORAL | Status: DC | PRN

## 2012-06-01 MED ORDER — METHOCARBAMOL 500 MG PO TABS: 500.0000 mg | ORAL_TABLET | Freq: Four times a day (QID) | ORAL | Status: DC | PRN

## 2012-06-01 NOTE — Discharge Summary (Signed)
Physician Discharge Summary  Patient ID: Tina Petty MRN: [id number] DOB/AGE: 1950-08-14 62 y.o.  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 Did well. Increased activity. Wound healing well. Pain markedly improved. Home POD 2, specific instructions given.  Consults: None  Significant Diagnostic Studies: none   Treatments: surgery: L 23 L 34 L 45 decompressive lam  Discharge Exam: Blood pressure 134/54, pulse 73, temperature 98 F (36.7 C), temperature source Oral, resp. rate 18, height 5\' 7"  (1.702 m), weight 100.699 kg (222 lb), SpO2 99.00%. Incision/Wound:clean and dry  Disposition: Final discharge disposition not confirmed  Discharge Orders   Future Orders Complete By Expires     Call MD for:  difficulty breathing, headache or visual disturbances  As directed     Call MD for:  hives  As directed     Call MD for:  persistant nausea and vomiting  As directed     Call MD for:  redness, tenderness, or signs of infection (pain, swelling, redness, odor or green/yellow discharge around incision site)  As directed     Call MD for:  severe uncontrolled pain  As directed     Call MD for:  temperature >100.4  As directed     Diet general  As directed     Discharge instructions  As directed     Comments:      Mostly bedrest. Get up 9 or 10 times each day and walk for 15-20 minutes each time. Very little sitting the first week. No riding in the car until your first post op appointment. If you had neck surgery...may shower from the chest down. If you had low back surgery....you may shower with a saran wrap covering over the incision. Take your pain medicine as needed...and other medicines that you are instructed to take. Call for an appointment...[phone].         Medication List    TAKE these medications       amLODipine-benazepril 5-20 MG per capsule  Commonly known as:  LOTREL  Take 1 capsule by mouth daily.     aspirin EC 81 MG tablet  Take 81 mg by mouth daily.     Calcium Carb-Cholecalciferol 600-800 MG-UNIT Tabs  Take 1 tablet by mouth 2 (two) times daily.     ezetimibe 10 MG tablet  Commonly known as:  ZETIA  Take 10 mg by mouth daily.     fish oil-omega-3 fatty acids 1000 MG capsule  Take 1 g by mouth 2 (two) times daily.     FLUoxetine 20 MG capsule  Commonly known as:  PROZAC  Take 20 mg by mouth daily.     methocarbamol 500 MG tablet  Commonly known as:  ROBAXIN  Take 1 tablet (500 mg total) by mouth every 6 (six) hours as needed.     oxyCODONE-acetaminophen 5-325 MG per tablet  Commonly known as:  PERCOCET/ROXICET  Take 1-2 tablets by mouth every 4 (four) hours as needed.     Potassium Gluconate 595 MG Caps  Take 1 capsule by mouth daily.     pravastatin 40 MG tablet  Commonly known as:  PRAVACHOL  Take 40 mg by mouth daily.     Vitamin D 2000 UNITS tablet  Take 2,000 Units by mouth daily.      ASK your doctor about  these medications       gabapentin 300 MG capsule  Commonly known as:  NEURONTIN  Take 300 mg by mouth 2 (two) times daily.         At home rest most of the time. Get up 9 or 10 times each day and take a 15 or 20 minute walk. No riding in the car and to your first postoperative appointment. If you have neck surgery you may shower from the chest down starting on the third postoperative day. If you had back surgery he may start showering on the third postoperative day with saran wrap wrapped around your incisional area 3 times. After the shower remove the saran wrap. Take pain medicine as needed and other medications as instructed. Call my office for an appointment.  SignedReinaldo Meeker, MD 06/01/2012, 9:25 AM

## 2012-06-02 NOTE — Care Management Note (Signed)
    Page 1 of 1   06/02/2012     8:49:15 AM   CARE MANAGEMENT NOTE 06/02/2012  Patient:  Tina Petty, Tina Petty   Account Number:  [id number]  Date Initiated:  06/02/2012  Documentation initiated by:  [facility]  Subjective/Objective Assessment:   admitted postop laminotomies L2-3, L3-4, L4-5     Action/Plan:   Pt/Ot evals- no follow up or equipment recommended   Anticipated DC Date:     Anticipated DC Plan:        DC Planning Services  CM consult      Choice offered to / List presented to:             Status of service:  Completed, signed off Medicare Important Message given?   (If response is "NO", the following Medicare IM given date fields will be blank) Date Medicare IM given:   Date Additional Medicare IM given:    Discharge Disposition:  HOME/SELF CARE  Per UR Regulation:  Reviewed for med. necessity/level of care/duration of stay  If discussed at Long Length of Stay Meetings, dates discussed:    Comments:

## 2013-11-19 IMAGING — CR DG LUMBAR SPINE 1V
1 series · 1 of 1 positions shown · non-contrast
Comparison: Radiographs dated 05/07/2012

CLINICAL DATA: Lumbar disc disease.

LUMBAR SPINE - 1 VIEW

[view not recorded]
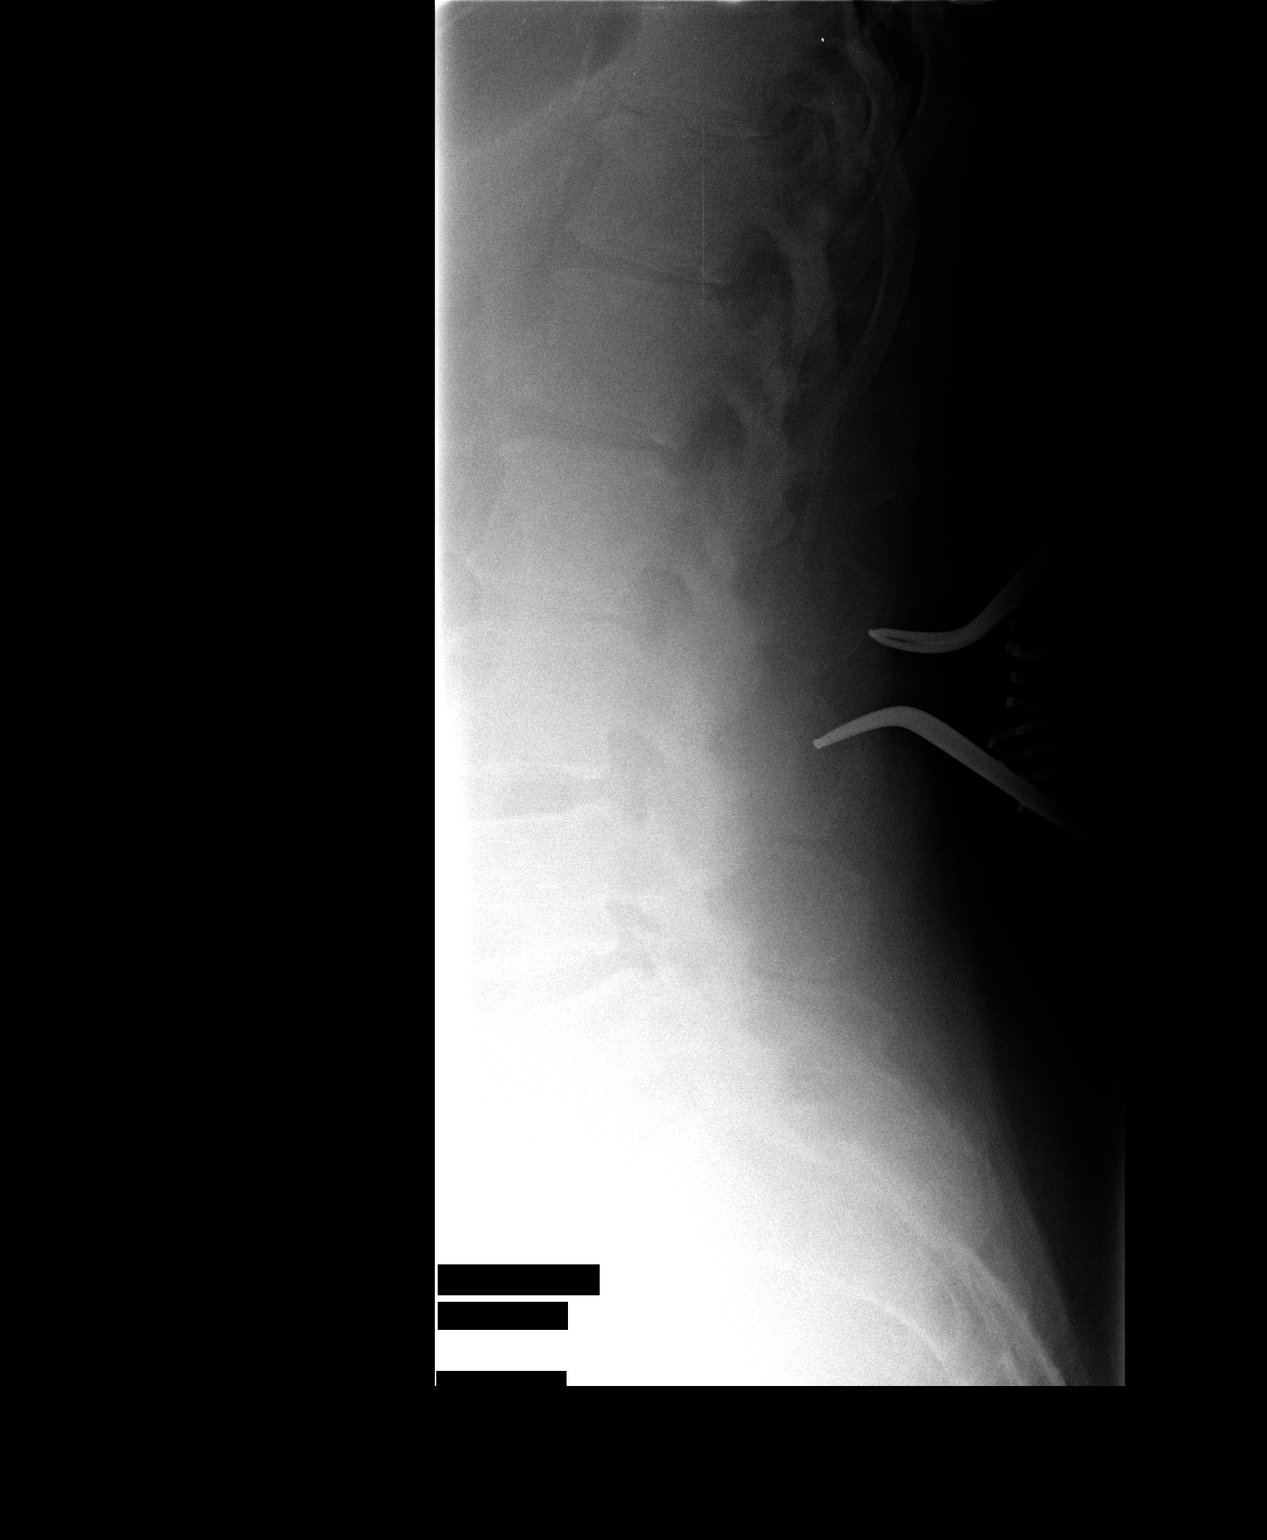

[1 of 1 positions shown; findings below may reference images not displayed]

FINDINGS: Radiograph demonstrates instruments on the spinous
processes of L2 and L3.
IMPRESSION: Instruments at L2-3.

## 2015-02-21 ENCOUNTER — Other Ambulatory Visit: Payer: Self-pay | Admitting: Nurse Practitioner

## 2015-02-21 DIAGNOSIS — Z1231 Encounter for screening mammogram for malignant neoplasm of breast: Secondary | ICD-10-CM

## 2015-02-24 ENCOUNTER — Ambulatory Visit
Admission: RE | Admit: 2015-02-24 | Discharge: 2015-02-24 | Disposition: A | Discharge status: Home / Self Care | Payer: BLUE CROSS/BLUE SHIELD | Source: Ambulatory Visit | Attending: Nurse Practitioner | Admitting: Nurse Practitioner

## 2015-02-24 DIAGNOSIS — Z1231 Encounter for screening mammogram for malignant neoplasm of breast: Secondary | ICD-10-CM | POA: Diagnosis not present

## 2015-03-01 ENCOUNTER — Other Ambulatory Visit: Payer: Self-pay | Admitting: Nurse Practitioner

## 2015-03-01 DIAGNOSIS — N63 Unspecified lump in unspecified breast: Secondary | ICD-10-CM

## 2015-03-03 ENCOUNTER — Other Ambulatory Visit: Payer: Self-pay | Admitting: Nurse Practitioner

## 2015-03-03 DIAGNOSIS — N6310 Unspecified lump in the right breast, unspecified quadrant: Secondary | ICD-10-CM

## 2015-03-03 DIAGNOSIS — R928 Other abnormal and inconclusive findings on diagnostic imaging of breast: Secondary | ICD-10-CM

## 2015-03-11 ENCOUNTER — Ambulatory Visit
Admission: RE | Admit: 2015-03-11 | Discharge: 2015-03-11 | Disposition: A | Discharge status: Home / Self Care | Payer: BLUE CROSS/BLUE SHIELD | Source: Ambulatory Visit | Attending: Nurse Practitioner | Admitting: Nurse Practitioner

## 2015-03-11 DIAGNOSIS — N6001 Solitary cyst of right breast: Secondary | ICD-10-CM | POA: Diagnosis not present

## 2015-03-11 DIAGNOSIS — N6002 Solitary cyst of left breast: Secondary | ICD-10-CM | POA: Diagnosis not present

## 2015-03-11 DIAGNOSIS — N6310 Unspecified lump in the right breast, unspecified quadrant: Secondary | ICD-10-CM

## 2015-03-11 DIAGNOSIS — R928 Other abnormal and inconclusive findings on diagnostic imaging of breast: Secondary | ICD-10-CM

## 2015-03-11 DIAGNOSIS — N63 Unspecified lump in breast: Secondary | ICD-10-CM | POA: Diagnosis present

## 2016-03-17 ENCOUNTER — Encounter: Payer: Self-pay | Admitting: *Deleted

## 2016-03-17 ENCOUNTER — Ambulatory Visit
Admission: EM | Admit: 2016-03-17 | Discharge: 2016-03-17 | Disposition: A | Discharge status: Home / Self Care | Payer: BLUE CROSS/BLUE SHIELD | Attending: Family Medicine | Admitting: Family Medicine

## 2016-03-17 DIAGNOSIS — H65192 Other acute nonsuppurative otitis media, left ear: Secondary | ICD-10-CM

## 2016-03-17 DIAGNOSIS — R062 Wheezing: Secondary | ICD-10-CM

## 2016-03-17 LAB — RAPID INFLUENZA A&B ANTIGENS: Influenza B (ARMC): NEGATIVE

## 2016-03-17 LAB — RAPID INFLUENZA A&B ANTIGENS (ARMC ONLY): INFLUENZA A (ARMC): NEGATIVE

## 2016-03-17 MED ORDER — ALBUTEROL SULFATE HFA 108 (90 BASE) MCG/ACT IN AERS: 2.0000 | INHALATION_SPRAY | RESPIRATORY_TRACT | 0 refills | Status: DC | PRN

## 2016-03-17 MED ORDER — AZITHROMYCIN 250 MG PO TABS: 250.0000 mg | ORAL_TABLET | Freq: Every day | ORAL | 0 refills | Status: DC

## 2016-03-17 NOTE — ED Provider Notes (Signed)
CSN: [id number]     Arrival date & time 03/17/16  0810 History   First MD Initiated Contact with Patient 03/17/16 [phone]     Chief Complaint  Patient presents with  . Nasal Congestion  . Facial Pain  . Cough  . Otalgia  . Fever   (Consider location/radiation/quality/duration/timing/severity/associated sxs/prior Treatment)  Patient is a well-appearing 65 year old female, presents today for 8 -day duration of fever, chills, fatigue, congestion, left ear pain, sinus pain, sinus pressure, sore throat and headache. Patient is particularly concerned for flu as she was exposed to somebody with flu last week. Patient reports that her left ear pain started 3 days ago with tinnitus. Ear pain is 8/10.  Patient denies hearing loss or ear discharge. Patient reports that the left ear pain radiates down to her jaw. Patient have been taking Tylenol severe cold and flu over-the-counter with no relief.  Blood pressure noted to be elevated today. Patient does have a history of hypertension. Patient reports her blood pressures normally well controlled.      Past Medical History:  Diagnosis Date  . Arthritis   . Depression   . Hx of dizziness   . Hyperlipidemia   . Hypertension   . PONV (postoperative nausea and vomiting)    Past Surgical History:  Procedure Laterality Date  . ABDOMINAL HYSTERECTOMY    . BREAST BIOPSY Right    neg- pappiloma  . carpel tunnel surgery    . CESAREAN SECTION    . KNEE SURGERY Left    cart. removed  . LUMBAR LAMINECTOMY/DECOMPRESSION MICRODISCECTOMY Bilateral 05/30/2012   Procedure: LUMBAR LAMINECTOMY/DECOMPRESSION MICRODISCECTOMY 3 LEVELS;  Surgeon: Barnett AbuHenry Elsner, MD;  Location: [facility];  Service: Neurosurgery;  Laterality: Bilateral;  Bilateral Lumbar two-three,three-four,four-five Laminectomy/Foraminotomy  . MANDIBLE SURGERY    . TRIGGER FINGER RELEASE     Family History  Problem Relation Age of Onset  . Breast cancer Cousin    Social History  Substance  Use Topics  . Smoking status: Never Smoker  . Smokeless tobacco: Never Used  . Alcohol use No   OB History    No data available     Review of Systems  Constitutional: Positive for chills, fatigue and fever.  HENT: Positive for congestion, ear pain, sinus pain, sinus pressure and sore throat. Negative for sneezing.   Respiratory: Positive for cough. Negative for wheezing.        +some SOB at times  Cardiovascular: Negative for chest pain and palpitations.  Gastrointestinal: Negative for abdominal pain, nausea and vomiting.  Skin: Negative for rash.  Neurological: Positive for headaches. Negative for dizziness.    Allergies  Patient has no known allergies.  Home Medications   Prior to Admission medications   Medication Sig Start Date End Date Taking? Authorizing Provider  amLODipine-benazepril (LOTREL) 5-20 MG per capsule Take 1 capsule by mouth daily.   Yes Historical Provider, MD  aspirin EC 81 MG tablet Take 81 mg by mouth daily.   Yes Historical Provider, MD  Calcium Carb-Cholecalciferol 600-800 MG-UNIT TABS Take 1 tablet by mouth 2 (two) times daily.   Yes Historical Provider, MD  Cholecalciferol (VITAMIN D) 2000 UNITS tablet Take 2,000 Units by mouth daily.   Yes Historical Provider, MD  fish oil-omega-3 fatty acids 1000 MG capsule Take 1 g by mouth 2 (two) times daily.   Yes Historical Provider, MD  FLUoxetine (PROZAC) 20 MG capsule Take 20 mg by mouth daily.   Yes Historical Provider, MD  Potassium Gluconate 595 MG  CAPS Take 1 capsule by mouth daily.   Yes Historical Provider, MD  pravastatin (PRAVACHOL) 40 MG tablet Take 40 mg by mouth daily.   Yes Historical Provider, MD  albuterol (PROVENTIL HFA;VENTOLIN HFA) 108 (90 Base) MCG/ACT inhaler Inhale 2 puffs into the lungs every 4 (four) hours as needed for wheezing or shortness of breath. 03/17/16   Lucia EstelleFeng Manhattan Mccuen, NP  azithromycin (ZITHROMAX) 250 MG tablet Take 1 tablet (250 mg total) by mouth daily. Take first 2 tablets  together, then 1 every day until finished. 03/17/16   Lucia EstelleFeng Fallon Haecker, NP  ezetimibe (ZETIA) 10 MG tablet Take 10 mg by mouth daily.    Historical Provider, MD  gabapentin (NEURONTIN) 300 MG capsule Take 300 mg by mouth 2 (two) times daily.    Historical Provider, MD  methocarbamol (ROBAXIN) 500 MG tablet Take 1 tablet (500 mg total) by mouth every 6 (six) hours as needed. 06/01/12   Aliene Beamsandy Kritzer, MD  oxyCODONE-acetaminophen (PERCOCET/ROXICET) 5-325 MG per tablet Take 1-2 tablets by mouth every 4 (four) hours as needed. 06/01/12   Aliene Beamsandy Kritzer, MD   Meds Ordered and Administered this Visit  Medications - No data to display  BP (!) 151/70 (BP Location: Left Arm)   Pulse 76   Temp 97.5 F (36.4 C) (Oral)   Resp 16   Ht 5\' 7"  (1.702 m)   Wt 237 lb (107.5 kg)   SpO2 95%   BMI 37.12 kg/m  No data found.   Physical Exam  Constitutional: She is oriented to person, place, and time. She appears well-developed and well-nourished.  HENT:  Head: Normocephalic and atraumatic.  Nose: Nose normal.  Mouth/Throat: Oropharynx is clear and moist. No oropharyngeal exudate.  Bilateral tympanic membrane are both erythematous, however the left is much worst compared to the right. There is no bulging or perforation noted.  Frontal and maxillary sinuses are nontender to palpate.  Tonsils are 2+ with no erythema or exudate.    Eyes: Pupils are equal, round, and reactive to light.  Neck: Neck supple.  Cardiovascular: Normal rate, regular rhythm, normal heart sounds and intact distal pulses.   No murmur heard. Pulmonary/Chest:  Patient in no acute respiratory distress. Effort normal. She does have mild diffuse wheezes throughout the lung field.   Abdominal: Soft. Bowel sounds are normal. She exhibits no distension. There is no tenderness.  Neurological: She is alert and oriented to person, place, and time.  Skin: Skin is warm and dry.  Psychiatric: She has a normal mood and affect.  Nursing note and  vitals reviewed.   Urgent Care Course   Clinical Course     Procedures (including critical care time)  Labs Review Labs Reviewed  RAPID INFLUENZA A&B ANTIGENS [facility] ONLY)    Imaging Review No results found.   MDM   1. Other acute nonsuppurative otitis media of left ear, recurrence not specified   2. Wheezing    Influenza test negative. Patient appears well with no distress.   1) Patient is noted to have an ear infection today. Will treat with Z-Pak today.  2) Patient does have wheezes today. Albuterol inhaler given to use as needed for shortness of breath; z-pack also given.      Lucia EstelleFeng Milaya Hora, NP 03/17/16 [phone]

## 2016-03-17 NOTE — ED Triage Notes (Signed)
Productive cough- green, left ear and facial pain, chills, body aches, runny nose and congestion x1 week.

## 2016-03-17 NOTE — ED Triage Notes (Signed)
Had contact with a friend last week that was dx with flu.

## 2016-04-03 ENCOUNTER — Other Ambulatory Visit: Payer: Self-pay | Admitting: Nurse Practitioner

## 2016-04-03 DIAGNOSIS — Z1231 Encounter for screening mammogram for malignant neoplasm of breast: Secondary | ICD-10-CM

## 2016-04-30 ENCOUNTER — Ambulatory Visit
Admission: RE | Admit: 2016-04-30 | Discharge: 2016-04-30 | Disposition: A | Discharge status: Home / Self Care | Payer: BLUE CROSS/BLUE SHIELD | Source: Ambulatory Visit | Attending: Nurse Practitioner | Admitting: Nurse Practitioner

## 2016-04-30 DIAGNOSIS — Z1231 Encounter for screening mammogram for malignant neoplasm of breast: Secondary | ICD-10-CM | POA: Diagnosis present

## 2016-06-26 DIAGNOSIS — M1712 Unilateral primary osteoarthritis, left knee: Secondary | ICD-10-CM | POA: Insufficient documentation

## 2017-03-28 ENCOUNTER — Other Ambulatory Visit: Payer: Self-pay | Admitting: Nurse Practitioner

## 2017-03-28 DIAGNOSIS — Z1231 Encounter for screening mammogram for malignant neoplasm of breast: Secondary | ICD-10-CM

## 2017-05-02 ENCOUNTER — Ambulatory Visit
Admission: RE | Admit: 2017-05-02 | Discharge: 2017-05-02 | Disposition: A | Discharge status: Home / Self Care | Payer: Medicare Other | Source: Ambulatory Visit | Attending: Nurse Practitioner | Admitting: Nurse Practitioner

## 2017-05-02 DIAGNOSIS — Z1231 Encounter for screening mammogram for malignant neoplasm of breast: Secondary | ICD-10-CM | POA: Insufficient documentation

## 2017-06-04 ENCOUNTER — Encounter: Payer: Self-pay | Admitting: *Deleted

## 2017-06-05 ENCOUNTER — Encounter: Payer: Self-pay | Admitting: *Deleted

## 2017-06-05 ENCOUNTER — Ambulatory Visit: Payer: Medicare Other | Admitting: Anesthesiology

## 2017-06-05 ENCOUNTER — Other Ambulatory Visit: Payer: Self-pay

## 2017-06-05 ENCOUNTER — Encounter
Admission: RE | Disposition: A | Discharge status: Home / Self Care | Payer: Self-pay | Source: Ambulatory Visit | Attending: Unknown Physician Specialty

## 2017-06-05 ENCOUNTER — Ambulatory Visit
Admission: RE | Admit: 2017-06-05 | Discharge: 2017-06-05 | Disposition: A | Discharge status: Home / Self Care | Payer: Medicare Other | Source: Ambulatory Visit | Attending: Unknown Physician Specialty | Admitting: Unknown Physician Specialty

## 2017-06-05 DIAGNOSIS — Z8371 Family history of colonic polyps: Secondary | ICD-10-CM | POA: Diagnosis not present

## 2017-06-05 DIAGNOSIS — K5730 Diverticulosis of large intestine without perforation or abscess without bleeding: Secondary | ICD-10-CM | POA: Insufficient documentation

## 2017-06-05 DIAGNOSIS — E785 Hyperlipidemia, unspecified: Secondary | ICD-10-CM | POA: Diagnosis not present

## 2017-06-05 DIAGNOSIS — M1990 Unspecified osteoarthritis, unspecified site: Secondary | ICD-10-CM | POA: Diagnosis not present

## 2017-06-05 DIAGNOSIS — Z1211 Encounter for screening for malignant neoplasm of colon: Secondary | ICD-10-CM | POA: Diagnosis not present

## 2017-06-05 DIAGNOSIS — F329 Major depressive disorder, single episode, unspecified: Secondary | ICD-10-CM | POA: Diagnosis not present

## 2017-06-05 DIAGNOSIS — Z800 Family history of malignant neoplasm of digestive organs: Secondary | ICD-10-CM | POA: Diagnosis not present

## 2017-06-05 DIAGNOSIS — I10 Essential (primary) hypertension: Secondary | ICD-10-CM | POA: Insufficient documentation

## 2017-06-05 DIAGNOSIS — K621 Rectal polyp: Secondary | ICD-10-CM | POA: Insufficient documentation

## 2017-06-05 DIAGNOSIS — Z888 Allergy status to other drugs, medicaments and biological substances status: Secondary | ICD-10-CM | POA: Insufficient documentation

## 2017-06-05 DIAGNOSIS — K640 First degree hemorrhoids: Secondary | ICD-10-CM | POA: Insufficient documentation

## 2017-06-05 DIAGNOSIS — Z79899 Other long term (current) drug therapy: Secondary | ICD-10-CM | POA: Insufficient documentation

## 2017-06-05 DIAGNOSIS — Z86010 Personal history of colonic polyps: Secondary | ICD-10-CM | POA: Insufficient documentation

## 2017-06-05 DIAGNOSIS — Z7982 Long term (current) use of aspirin: Secondary | ICD-10-CM | POA: Insufficient documentation

## 2017-06-05 HISTORY — PX: COLONOSCOPY WITH PROPOFOL: SHX5780

## 2017-06-05 SURGERY — COLONOSCOPY WITH PROPOFOL
Anesthesia: General

## 2017-06-05 MED ORDER — SODIUM CHLORIDE 0.9 % IV SOLN
INTRAVENOUS | Status: DC
  Administered 2017-06-05: 1000 mL via INTRAVENOUS

## 2017-06-05 MED ORDER — PROPOFOL 500 MG/50ML IV EMUL
INTRAVENOUS | Status: AC
Start: 2017-06-05 — End: 2017-06-05
  Filled 2017-06-05: qty 50

## 2017-06-05 MED ORDER — SODIUM CHLORIDE 0.9 % IV SOLN: INTRAVENOUS | Status: DC

## 2017-06-05 MED ORDER — PROPOFOL 500 MG/50ML IV EMUL
INTRAVENOUS | Status: DC | PRN
  Administered 2017-06-05: 140 ug/kg/min via INTRAVENOUS

## 2017-06-05 MED ORDER — PROPOFOL 10 MG/ML IV BOLUS
INTRAVENOUS | Status: DC | PRN
  Administered 2017-06-05: 110 mg via INTRAVENOUS
  Administered 2017-06-05: 40 mg via INTRAVENOUS

## 2017-06-05 NOTE — Transfer of Care (Signed)
Immediate Anesthesia Transfer of Care Note  Patient: Tina BlackwaterDenise T Petty  Procedure(s) Performed: COLONOSCOPY WITH PROPOFOL (N/A )  Patient Location: Endoscopy Unit  Anesthesia Type:General  Level of Consciousness: sedated  Airway & Oxygen Therapy: Patient Spontanous Breathing and Patient connected to nasal cannula oxygen  Post-op Assessment: Report given to RN and Post -op Vital signs reviewed and stable  Post vital signs: stable  Last Vitals:  Vitals:   06/05/17 0933  BP: 135/63  Pulse: 62  Resp: 20  Temp: (!) 35.9 C  SpO2: 96%    Last Pain:  Vitals:   06/05/17 0933  TempSrc: Tympanic      Patients Stated Pain Goal: 5 (06/05/17 0933)  Complications: No apparent anesthesia complications

## 2017-06-05 NOTE — Anesthesia Postprocedure Evaluation (Signed)
Anesthesia Post Note  Patient: Adrian BlackwaterDenise T Ask  Procedure(s) Performed: COLONOSCOPY WITH PROPOFOL (N/A )  Patient location during evaluation: Endoscopy Anesthesia Type: General Level of consciousness: awake and alert Pain management: pain level controlled Vital Signs Assessment: post-procedure vital signs reviewed and stable Respiratory status: spontaneous breathing, nonlabored ventilation, respiratory function stable and patient connected to nasal cannula oxygen Cardiovascular status: blood pressure returned to baseline and stable Postop Assessment: no apparent nausea or vomiting Anesthetic complications: no     Last Vitals:  Vitals:   06/05/17 1030 06/05/17 1040  BP:  131/78  Pulse: (!) 54 (!) 55  Resp: 20 20  Temp:    SpO2: 98% 100%    Last Pain:  Vitals:   06/05/17 1010  TempSrc: Tympanic                 Cleda MccreedyJoseph K Sagrario Lineberry

## 2017-06-05 NOTE — Op Note (Signed)
[facility] Patient Name: Tina Petty Procedure Date: 06/05/2017 9:46 AM MRN: [id number] Account #: [id number] Date of Birth: 23-Jul-1950 Admit Type: Outpatient Age: 67 Room: [facility] ENDO ROOM 4 Gender: Female Note Status: Finalized Procedure:            Colonoscopy Indications:          High risk colon cancer surveillance: Personal history                        of colonic polyps Providers:            Scot Jun, MD Referring MD:         Caryl Asp (Referring MD) Medicines:            Propofol per Anesthesia Complications:        No immediate complications. Procedure:            Pre-Anesthesia Assessment:                       - After reviewing the risks and benefits, the patient                        was deemed in satisfactory condition to undergo the                        procedure.                       After obtaining informed consent, the colonoscope was                        passed under direct vision. Throughout the procedure,                        the patient's blood pressure, pulse, and oxygen                        saturations were monitored continuously. The                        Colonoscope was introduced through the anus and                        advanced to the the cecum, identified by appendiceal                        orifice and ileocecal valve. The colonoscopy was                        performed without difficulty. The patient tolerated the                        procedure well. The quality of the bowel preparation                        was excellent. Findings:      Three sessile polyps were found in the rectum. The polyps were       diminutive in size. These polyps were removed with a jumbo cold forceps.       Resection and retrieval were complete.      A diminutive polyp was  found in the cecum. The polyp was sessile. The       polyp was removed with a jumbo cold forceps. Resection and retrieval       were  complete.      Multiple small-mouthed diverticula were found in the sigmoid colon.      Internal hemorrhoids were found during endoscopy. The hemorrhoids were       small and Grade I (internal hemorrhoids that do not prolapse).      The exam was otherwise without abnormality. Impression:           - Three diminutive polyps in the rectum, removed with a                        jumbo cold forceps. Resected and retrieved.                       - One diminutive polyp in the cecum, removed with a                        jumbo cold forceps. Resected and retrieved.                       - Diverticulosis in the sigmoid colon.                       - Internal hemorrhoids.                       - The examination was otherwise normal. Recommendation:       - Await pathology results. Scot Junobert T Willella Harding, MD 06/05/2017 10:12:58 AM This report has been signed electronically. Number of Addenda: 0 Note Initiated On: 06/05/2017 9:46 AM Scope Withdrawal Time: 0 hours 11 minutes 57 seconds  Total Procedure Duration: 0 hours 17 minutes 5 seconds       [facility]

## 2017-06-05 NOTE — Anesthesia Preprocedure Evaluation (Signed)
Anesthesia Evaluation  Patient identified by MRN, date of birth, ID band Patient awake    Reviewed: Allergy & Precautions, H&P , NPO status , Patient's Chart, lab work & pertinent test results  History of Anesthesia Complications (+) PONV and history of anesthetic complications  Airway Mallampati: II  TM Distance: <3 FB Neck ROM: limited    Dental  (+) Chipped, Poor Dentition   Pulmonary neg shortness of breath,           Cardiovascular Exercise Tolerance: Good hypertension, (-) angina(-) Past MI and (-) DOE      Neuro/Psych PSYCHIATRIC DISORDERS Depression negative neurological ROS     GI/Hepatic negative GI ROS, Neg liver ROS, neg GERD  ,  Endo/Other  negative endocrine ROS  Renal/GU negative Renal ROS  negative genitourinary   Musculoskeletal  (+) Arthritis ,   Abdominal   Peds  Hematology negative hematology ROS (+)   Anesthesia Other Findings Signs and symptoms suggestive of sleep apnea   Past Medical History: No date: Arthritis No date: Depression No date: Hx of dizziness No date: Hyperlipidemia No date: Hypertension No date: PONV (postoperative nausea and vomiting)  Past Surgical History: No date: ABDOMINAL HYSTERECTOMY No date: adenomatours colon polyp No date: BREAST BIOPSY; Right     Comment:  neg- papiloma No date: carpel tunnel surgery No date: CESAREAN SECTION No date: chronic back pain unspecified     Comment:  severe multilevel spondylitic stenosis No date: KNEE SURGERY; Left     Comment:  cart. removed 05/30/2012: LUMBAR LAMINECTOMY/DECOMPRESSION MICRODISCECTOMY; Bilateral     Comment:  Procedure: LUMBAR LAMINECTOMY/DECOMPRESSION               MICRODISCECTOMY 3 LEVELS;  Surgeon: Barnett AbuHenry Elsner, MD;                Location: [facility];  Service: Neurosurgery;                Laterality: Bilateral;  Bilateral Lumbar               two-three,three-four,four-five  Laminectomy/Foraminotomy No date: MANDIBLE SURGERY No date: TRIGGER FINGER RELEASE  BMI    Body Mass Index:  37.12 kg/m      Reproductive/Obstetrics negative OB ROS                             Anesthesia Physical Anesthesia Plan  ASA: III  Anesthesia Plan: General   Post-op Pain Management:    Induction: Intravenous  PONV Risk Score and Plan: Propofol infusion and TIVA  Airway Management Planned: Natural Airway and Nasal Cannula  Additional Equipment:   Intra-op Plan:   Post-operative Plan:   Informed Consent: I have reviewed the patients History and Physical, chart, labs and discussed the procedure including the risks, benefits and alternatives for the proposed anesthesia with the patient or authorized representative who has indicated his/her understanding and acceptance.   Dental Advisory Given  Plan Discussed with: Anesthesiologist, CRNA and Surgeon  Anesthesia Plan Comments: (Patient consented for risks of anesthesia including but not limited to:  - adverse reactions to medications - risk of intubation if required - damage to teeth, lips or other oral mucosa - sore throat or hoarseness - Damage to heart, brain, lungs or loss of life  Patient voiced understanding.)        Anesthesia Quick Evaluation

## 2017-06-05 NOTE — H&P (Signed)
Primary Care Physician:  Myrene BuddyGauger, Sarah Kathryn, NP Primary Gastroenterologist:  Dr. Mechele CollinElliott  Pre-Procedure History & Physical: HPI:  Tina BlackwaterDenise T Petty is a 67 y.o. female is here for an colonoscopy.  Done for PH colon polyps and Family history  Of colon cancer in father and sister.   Past Medical History:  Diagnosis Date  . Arthritis   . Depression   . Hx of dizziness   . Hyperlipidemia   . Hypertension   . PONV (postoperative nausea and vomiting)     Past Surgical History:  Procedure Laterality Date  . ABDOMINAL HYSTERECTOMY    . adenomatours colon polyp    . BREAST BIOPSY Right    neg- papiloma  . carpel tunnel surgery    . CESAREAN SECTION    . chronic back pain unspecified     severe multilevel spondylitic stenosis  . KNEE SURGERY Left    cart. removed  . LUMBAR LAMINECTOMY/DECOMPRESSION MICRODISCECTOMY Bilateral 05/30/2012   Procedure: LUMBAR LAMINECTOMY/DECOMPRESSION MICRODISCECTOMY 3 LEVELS;  Surgeon: Barnett AbuHenry Elsner, MD;  Location: [facility];  Service: Neurosurgery;  Laterality: Bilateral;  Bilateral Lumbar two-three,three-four,four-five Laminectomy/Foraminotomy  . MANDIBLE SURGERY    . TRIGGER FINGER RELEASE      Prior to Admission medications   Medication Sig Start Date End Date Taking? Authorizing Provider  amLODipine-benazepril (LOTREL) 5-20 MG per capsule Take 1 capsule by mouth daily.   Yes [provider]  aspirin EC 81 MG tablet Take 81 mg by mouth daily.   Yes [provider]  Calcium Carb-Cholecalciferol 600-800 MG-UNIT TABS Take 1 tablet by mouth 2 (two) times daily.   Yes [provider]  Cholecalciferol (VITAMIN D) 2000 UNITS tablet Take 2,000 Units by mouth daily.   Yes [provider]  fish oil-omega-3 fatty acids 1000 MG capsule Take 1 g by mouth 2 (two) times daily.   Yes [provider]  FLUoxetine (PROZAC) 20 MG capsule Take 20 mg by mouth daily.   Yes [provider]  meloxicam (MOBIC) 15 MG  tablet Take 15 mg by mouth daily.   Yes [provider]  Potassium Gluconate 595 MG CAPS Take 1 capsule by mouth daily.   Yes [provider]  pravastatin (PRAVACHOL) 40 MG tablet Take 40 mg by mouth daily.   Yes [provider]  albuterol (PROVENTIL HFA;VENTOLIN HFA) 108 (90 Base) MCG/ACT inhaler Inhale 2 puffs into the lungs every 4 (four) hours as needed for wheezing or shortness of breath. Patient not taking: Reported on 06/05/2017 03/17/16   Lucia EstelleZheng, Feng, NP  azithromycin (ZITHROMAX) 250 MG tablet Take 1 tablet (250 mg total) by mouth daily. Take first 2 tablets together, then 1 every day until finished. Patient not taking: Reported on 06/05/2017 03/17/16   Lucia EstelleZheng, Feng, NP  ezetimibe (ZETIA) 10 MG tablet Take 10 mg by mouth daily.    [provider]  gabapentin (NEURONTIN) 300 MG capsule Take 300 mg by mouth 2 (two) times daily.    [provider]  methocarbamol (ROBAXIN) 500 MG tablet Take 1 tablet (500 mg total) by mouth every 6 (six) hours as needed. Patient not taking: Reported on 06/05/2017 06/01/12   Aliene BeamsKritzer, Randy, MD  oxyCODONE-acetaminophen (PERCOCET/ROXICET) 5-325 MG per tablet Take 1-2 tablets by mouth every 4 (four) hours as needed. Patient not taking: Reported on 06/05/2017 06/01/12   Aliene BeamsKritzer, Randy, MD    Allergies as of 05/05/2017  . (No Known Allergies)    Family History  Problem Relation Age of  Onset  . Breast cancer Cousin        1st cousin paternal side    Social History   Socioeconomic History  . Marital status: Widowed    Spouse name: Not on file  . Number of children: Not on file  . Years of education: Not on file  . Highest education level: Not on file  Social Needs  . Financial resource strain: Not on file  . Food insecurity - worry: Not on file  . Food insecurity - inability: Not on file  . Transportation needs - medical: Not on file  . Transportation needs - non-medical: Not on file  Occupational History  .  Not on file  Tobacco Use  . Smoking status: Never Smoker  . Smokeless tobacco: Never Used  Substance and Sexual Activity  . Alcohol use: No  . Drug use: No  . Sexual activity: Not on file  Other Topics Concern  . Not on file  Social History Narrative  . Not on file    Review of Systems: See HPI, otherwise negative ROS  Physical Exam: BP 135/63   Pulse 62   Temp (!) 96.7 F (35.9 C) (Tympanic)   Resp 20   Ht 5\' 7"  (1.702 m)   Wt 107.5 kg (237 lb)   SpO2 96%   BMI 37.12 kg/m  General:   Alert,  pleasant and cooperative in NAD Head:  Normocephalic and atraumatic. Neck:  Supple; no masses or thyromegaly. Lungs:  Clear throughout to auscultation.    Heart:  Regular rate and rhythm. Abdomen:  Soft, nontender and nondistended. Normal bowel sounds, without guarding, and without rebound.   Neurologic:  Alert and  oriented x4;  grossly normal neurologically.  Impression/Plan: Tina Petty is here for an colonoscopy to be performed for [facility] colon polyps and FH of colon cancer in father ans sister.  Risks, benefits, limitations, and alternatives regarding  colonoscopy have been reviewed with the patient.  Questions have been answered.  All parties agreeable.   Lynnae Prude, MD  06/05/2017, 9:47 AM

## 2017-06-05 NOTE — Anesthesia Post-op Follow-up Note (Signed)
Anesthesia QCDR form completed.        

## 2017-06-06 LAB — SURGICAL PATHOLOGY

## 2017-06-07 ENCOUNTER — Encounter: Payer: Self-pay | Admitting: Unknown Physician Specialty

## 2018-04-04 ENCOUNTER — Other Ambulatory Visit: Payer: Self-pay | Admitting: Nurse Practitioner

## 2018-04-04 DIAGNOSIS — Z1231 Encounter for screening mammogram for malignant neoplasm of breast: Secondary | ICD-10-CM

## 2018-05-06 ENCOUNTER — Ambulatory Visit
Admission: RE | Admit: 2018-05-06 | Discharge: 2018-05-06 | Disposition: A | Discharge status: Home / Self Care | Payer: Medicare Other | Source: Ambulatory Visit | Attending: Nurse Practitioner | Admitting: Nurse Practitioner

## 2018-05-06 DIAGNOSIS — Z1231 Encounter for screening mammogram for malignant neoplasm of breast: Secondary | ICD-10-CM | POA: Diagnosis not present

## 2018-05-12 ENCOUNTER — Other Ambulatory Visit: Payer: Self-pay | Admitting: Nurse Practitioner

## 2018-05-12 DIAGNOSIS — N6320 Unspecified lump in the left breast, unspecified quadrant: Secondary | ICD-10-CM

## 2018-05-12 DIAGNOSIS — R928 Other abnormal and inconclusive findings on diagnostic imaging of breast: Secondary | ICD-10-CM

## 2018-05-15 ENCOUNTER — Ambulatory Visit
Admission: RE | Admit: 2018-05-15 | Discharge: 2018-05-15 | Disposition: A | Discharge status: Home / Self Care | Payer: Medicare Other | Source: Ambulatory Visit | Attending: Nurse Practitioner | Admitting: Nurse Practitioner

## 2018-05-15 ENCOUNTER — Encounter: Payer: Self-pay | Admitting: Radiology

## 2018-05-15 DIAGNOSIS — R928 Other abnormal and inconclusive findings on diagnostic imaging of breast: Secondary | ICD-10-CM

## 2018-05-15 DIAGNOSIS — N6320 Unspecified lump in the left breast, unspecified quadrant: Secondary | ICD-10-CM | POA: Diagnosis present

## 2018-05-19 ENCOUNTER — Other Ambulatory Visit: Payer: Self-pay | Admitting: Nurse Practitioner

## 2018-05-19 DIAGNOSIS — N6320 Unspecified lump in the left breast, unspecified quadrant: Secondary | ICD-10-CM

## 2018-10-02 ENCOUNTER — Other Ambulatory Visit: Payer: Self-pay

## 2018-10-02 DIAGNOSIS — Z20822 Contact with and (suspected) exposure to covid-19: Secondary | ICD-10-CM

## 2018-10-05 LAB — NOVEL CORONAVIRUS, NAA: SARS-CoV-2, NAA: NOT DETECTED

## 2018-11-18 ENCOUNTER — Ambulatory Visit
Admission: RE | Admit: 2018-11-18 | Discharge: 2018-11-18 | Disposition: A | Discharge status: Home / Self Care | Payer: Medicare Other | Source: Ambulatory Visit | Attending: Nurse Practitioner | Admitting: Nurse Practitioner

## 2018-11-18 DIAGNOSIS — N6320 Unspecified lump in the left breast, unspecified quadrant: Secondary | ICD-10-CM

## 2018-11-18 DIAGNOSIS — N6321 Unspecified lump in the left breast, upper outer quadrant: Secondary | ICD-10-CM | POA: Insufficient documentation

## 2018-12-03 ENCOUNTER — Other Ambulatory Visit: Payer: Self-pay | Admitting: Nurse Practitioner

## 2018-12-03 DIAGNOSIS — N6320 Unspecified lump in the left breast, unspecified quadrant: Secondary | ICD-10-CM

## 2019-05-12 ENCOUNTER — Ambulatory Visit
Admission: RE | Admit: 2019-05-12 | Discharge: 2019-05-12 | Disposition: A | Discharge status: Home / Self Care | Payer: Medicare Other | Source: Ambulatory Visit | Attending: Nurse Practitioner | Admitting: Nurse Practitioner

## 2019-05-12 DIAGNOSIS — N6321 Unspecified lump in the left breast, upper outer quadrant: Secondary | ICD-10-CM | POA: Diagnosis not present

## 2019-05-12 DIAGNOSIS — N6320 Unspecified lump in the left breast, unspecified quadrant: Secondary | ICD-10-CM

## 2019-11-04 IMAGING — MG DIGITAL DIAGNOSTIC UNILATERAL LEFT MAMMOGRAM WITH TOMO AND CAD
4 series · 4 of 12 positions shown · non-contrast
Comparison: Previous exam(s).

CLINICAL DATA: 67-year-old female recalled from 3D screening
mammogram dated 05/06/2018 for possible left breast mass.

EXAM:
DIGITAL DIAGNOSTIC LEFT MAMMOGRAM WITH CAD AND TOMO
ULTRASOUND LEFT BREAST

[L CC synth-2D]
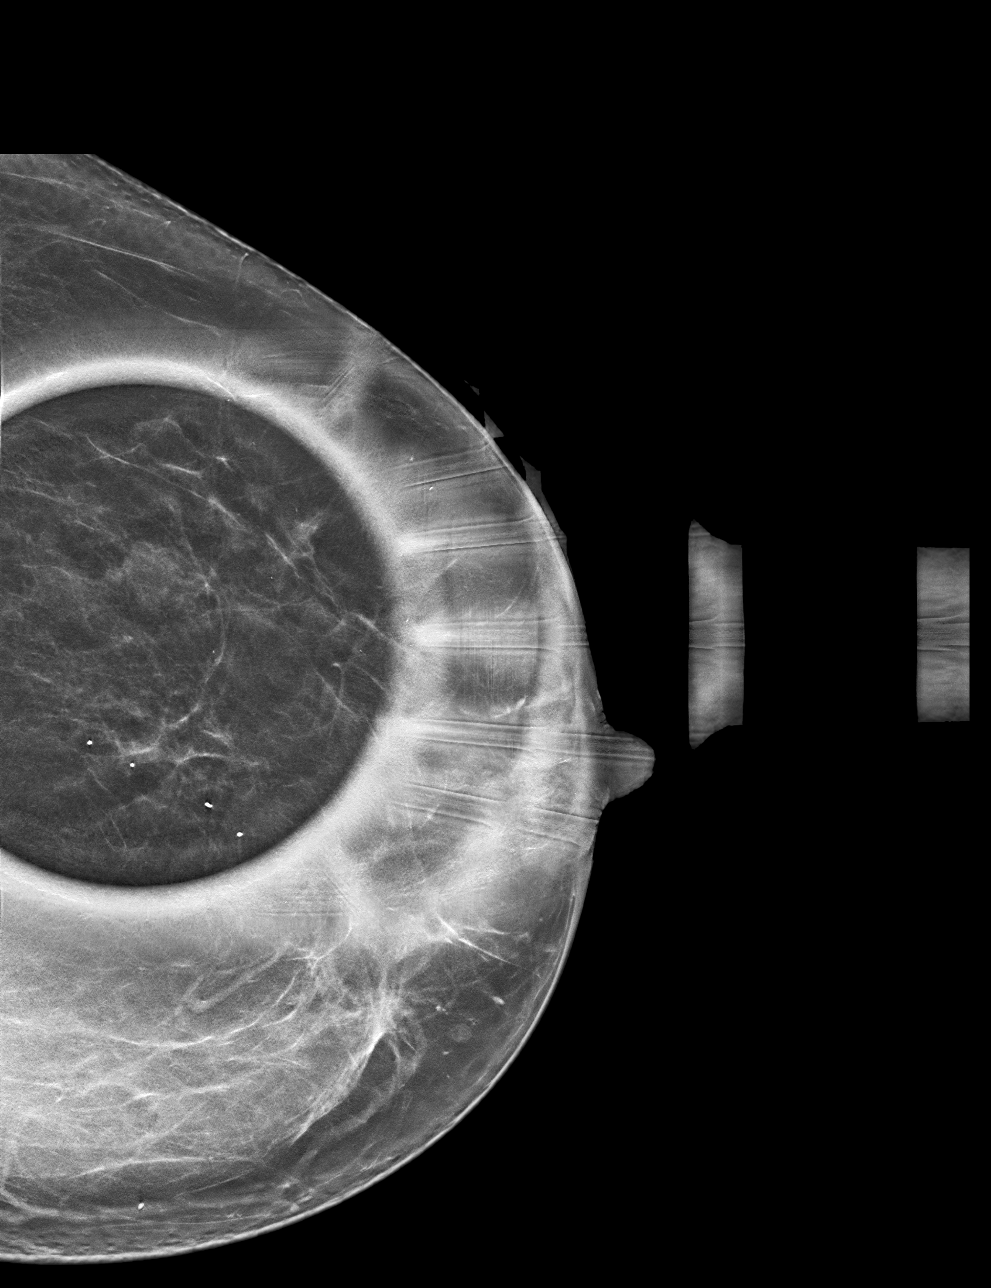

[L MLO synth-2D]
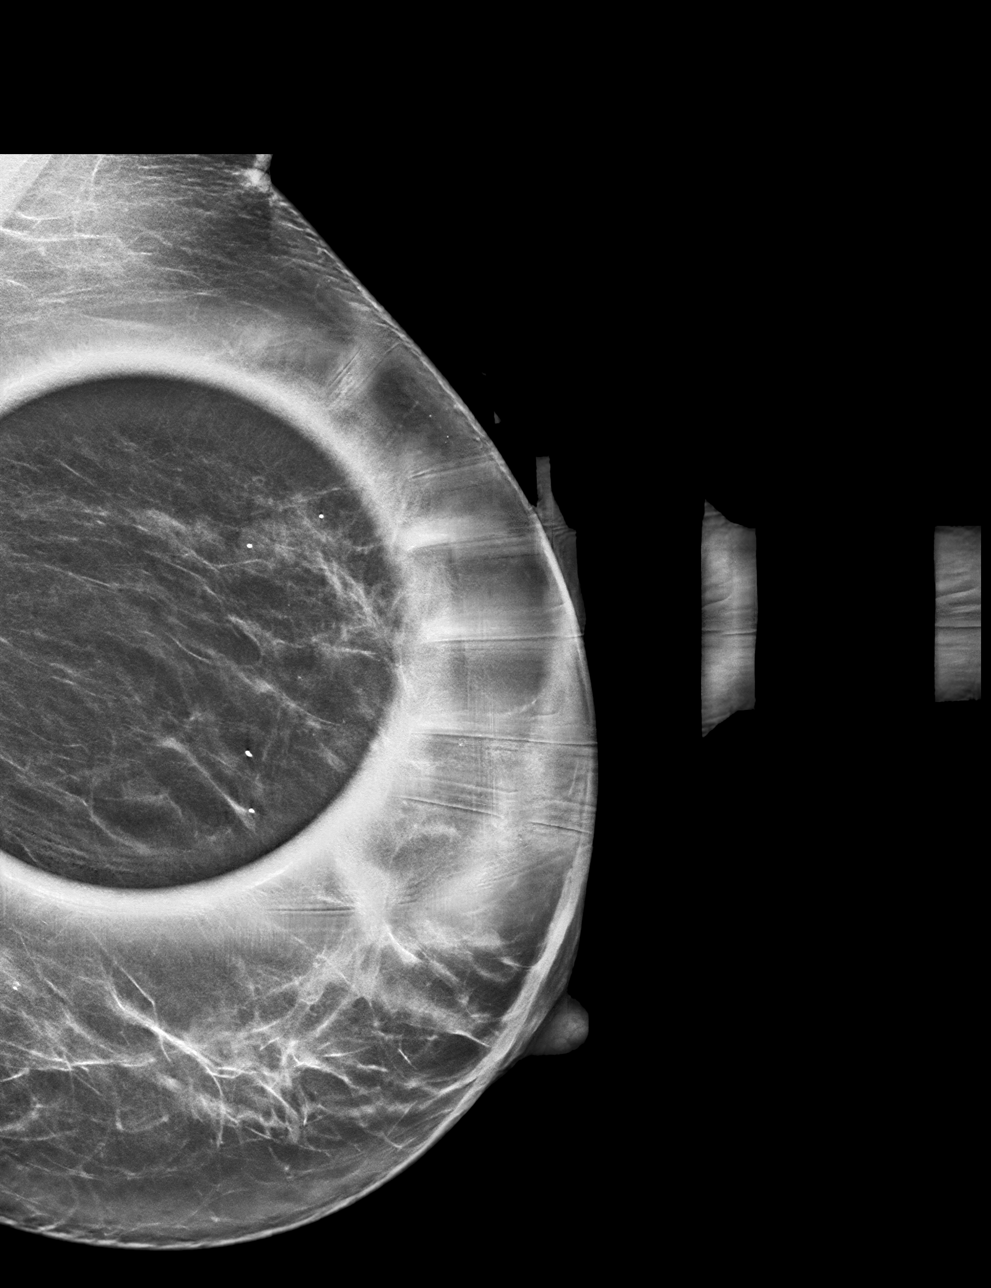

[L MLO tomo · tomo slice 33/66.0]
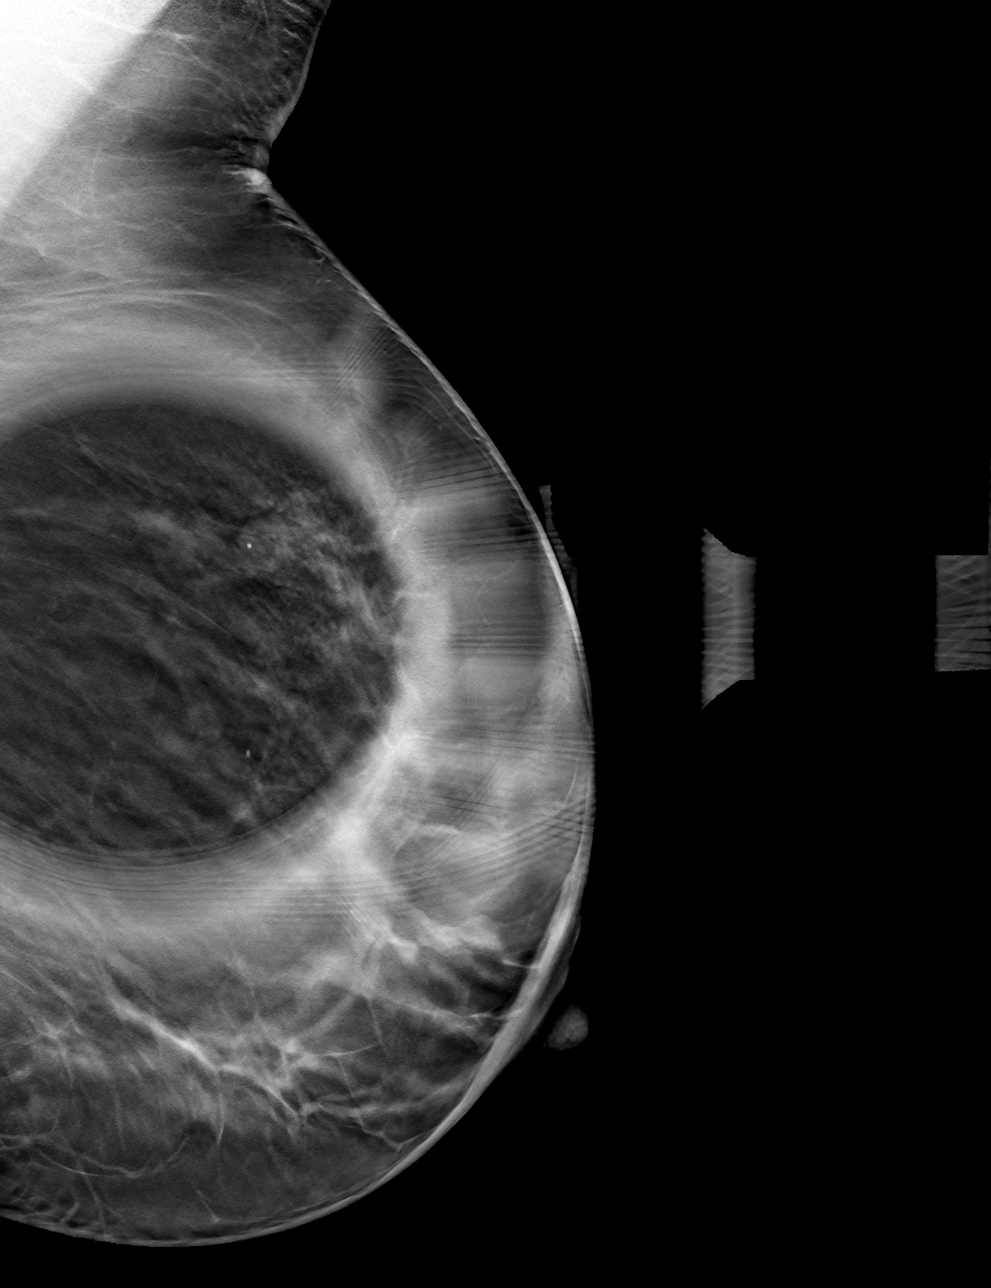

[L CC tomo · tomo slice 29/56.0]
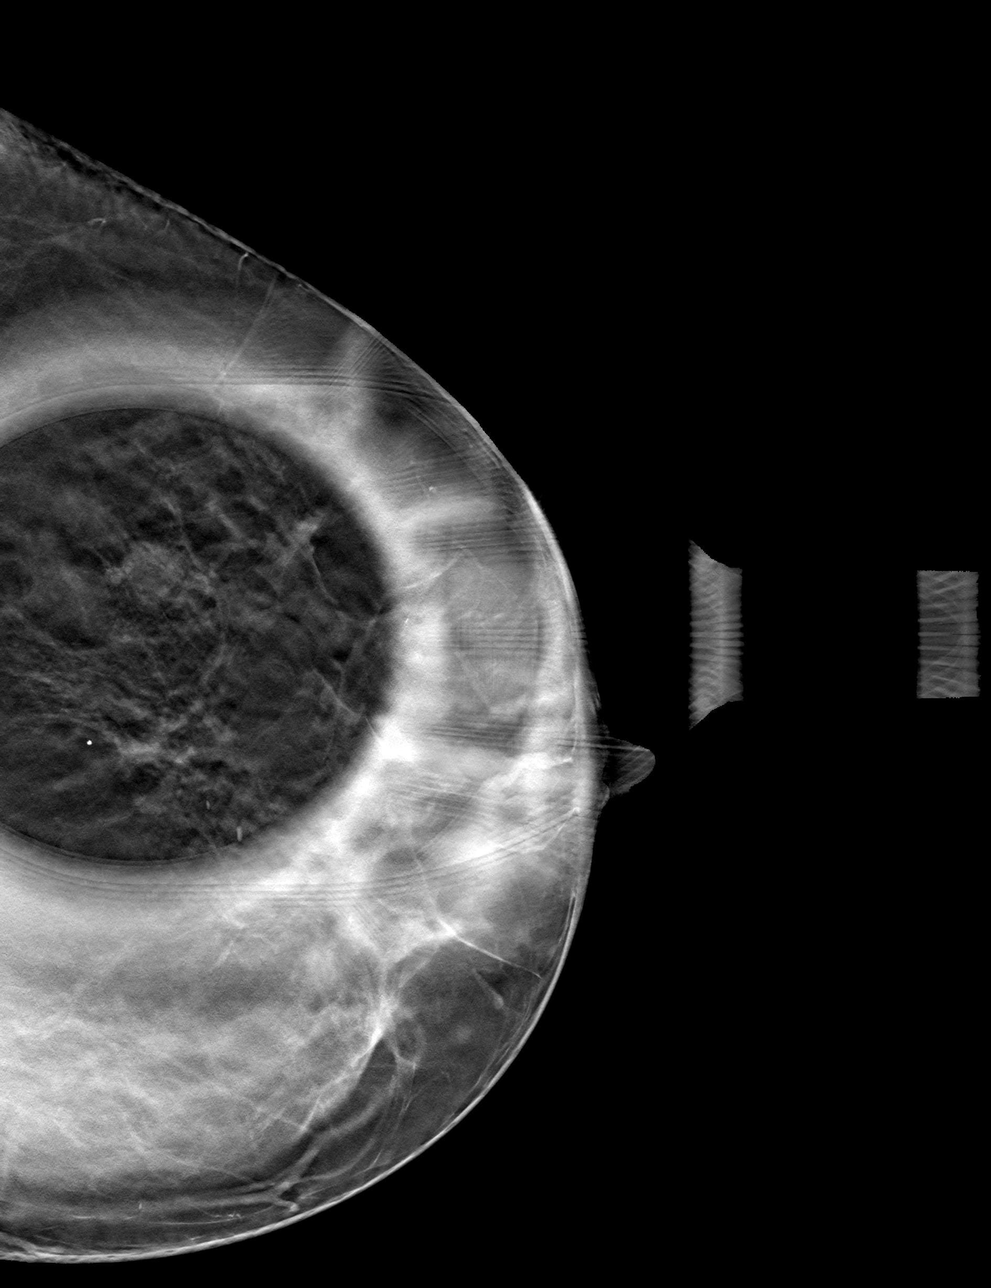

[4 of 12 positions shown; findings below may reference images not displayed]

ACR Breast Density Category b: There are scattered areas of
fibroglandular density.
FINDINGS: There is a persistent oval, circumscribed mass in the upper outer
quadrant at mid to posterior depth. This is best seen on the 3D
tomosynthesis views. However, the 2D appearance is similar when
compared to multiple prior studies and retrospect. Further
evaluation with ultrasound was performed.

Mammographic images were processed with CAD.

Targeted ultrasound is performed, showing an oval, circumscribed
multi-cystic mass at the 2 o'clock position 7 cm from the nipple. It
measures 1.7 x 1.0 x 0.8 cm. There is no associated vascularity.
IMPRESSION: Probably benign left breast mass likely representing a cluster of
cysts/apocrine metaplasia. Recommendation is for short-term imaging
follow-up.

RECOMMENDATION:
Diagnostic left breast mammogram and ultrasound in 6 months.

I have discussed the findings and recommendations with the patient.
Results were also provided in writing at the conclusion of the
visit. If applicable, a reminder letter will be sent to the patient
regarding the next appointment.

BI-RADS CATEGORY  3: Probably benign.

## 2020-02-28 NOTE — Discharge Instructions (Signed)
Instructions after Total Knee Replacement   Rhyan Wolters P. Pierra Skora, Jr., M.D.     Dept. of Orthopaedics & Sports Medicine  [facility]  [address]  [place], [place]  [address]  Phone: [phone]   Fax: [phone]    DIET: Drink plenty of non-alcoholic fluids. Resume your normal diet. Include foods high in fiber.  ACTIVITY:  You may use crutches or a walker with weight-bearing as tolerated, unless instructed otherwise. You may be weaned off of the walker or crutches by your Physical Therapist.  Do NOT place pillows under the knee. Anything placed under the knee could limit your ability to straighten the knee.   Continue doing gentle exercises. Exercising will reduce the pain and swelling, increase motion, and prevent muscle weakness.   Please continue to use the TED compression stockings for 6 weeks. You may remove the stockings at night, but should reapply them in the morning. Do not drive or operate any equipment until instructed.  WOUND CARE:  Continue to use the PolarCare or ice packs periodically to reduce pain and swelling. You may bathe or shower after the staples are removed at the first office visit following surgery.  MEDICATIONS: You may resume your regular medications. Please take the pain medication as prescribed on the medication. Do not take pain medication on an empty stomach. You have been given a prescription for a blood thinner (Lovenox or Coumadin). Please take the medication as instructed. (NOTE: After completing a 2 week course of Lovenox, take one Enteric-coated aspirin once a day. This along with elevation will help reduce the possibility of phlebitis in your operated leg.) Do not drive or drink alcoholic beverages when taking pain medications.  CALL THE OFFICE FOR: Temperature above 101 degrees Excessive bleeding or drainage on the dressing. Excessive swelling, coldness, or paleness of the toes. Persistent nausea and vomiting.  FOLLOW-UP:  You  should have an appointment to return to the office in 10-14 days after surgery. Arrangements have been made for continuation of Physical Therapy (either home therapy or outpatient therapy).   [facility] Department Directory         [email or web address]       [email or web address]          Cardiology  Appointments: [place] - [phone] [place] - [phone]  Endocrinology  Appointments: [place] - [phone] [place] - [phone]  Gastroenterology  Appointments: [place] - [phone] [place] - [phone]        General Surgery   Appointments: [place] - [phone]  Internal Medicine/Family Medicine  Appointments: [place] - [phone] [place] - [phone] [place] - [phone]  Metabolic and Weigh Loss Surgery  Appointments: [place] - [phone]        Neurology  Appointments: [place] - [phone] [place] - [phone]  Neurosurgery  Appointments: [place] - [phone]  Obstetrics & Gynecology  Appointments: [place] - [phone] [place] - [phone]        Pediatrics  Appointments: [place] - [phone] [place] - [phone]  Physiatry  Appointments: [place] -[phone]  Physical Therapy  Appointments: [place] - [phone] [place] - [phone]        Podiatry  Appointments: [place] - [phone] [place] - [phone]  Pulmonology  Appointments: [place] - [phone]  Rheumatology  Appointments: [place] - [phone]        [place] Location: [facility]  [address] [place], [place]  [address]  [place] Location: [facility] [address] [place], [place]  [address]  [place] Location: [facility] [address] [place],   [address]    

## 2020-03-08 ENCOUNTER — Other Ambulatory Visit: Payer: Self-pay

## 2020-03-08 ENCOUNTER — Encounter
Admission: RE | Admit: 2020-03-08 | Discharge: 2020-03-08 | Disposition: A | Discharge status: Home / Self Care | Payer: Medicare Other | Source: Ambulatory Visit | Attending: Orthopedic Surgery | Admitting: Orthopedic Surgery

## 2020-03-08 DIAGNOSIS — Z01818 Encounter for other preprocedural examination: Secondary | ICD-10-CM | POA: Diagnosis present

## 2020-03-08 LAB — COMPREHENSIVE METABOLIC PANEL
ALT: 17 U/L (ref 0–44)
AST: 16 U/L (ref 15–41)
Albumin: 4.3 g/dL (ref 3.5–5.0)
Alkaline Phosphatase: 55 U/L (ref 38–126)
Anion gap: 9 (ref 5–15)
BUN: 14 mg/dL (ref 8–23)
CO2: 27 mmol/L (ref 22–32)
Calcium: 9.8 mg/dL (ref 8.9–10.3)
Chloride: 104 mmol/L (ref 98–111)
Creatinine, Ser: 0.59 mg/dL (ref 0.44–1.00)
GFR, Estimated: >60 mL/min (ref >60)
Glucose, Bld: 93 mg/dL (ref 70–99)
Potassium: 3.7 mmol/L (ref 3.5–5.1)
Sodium: 140 mmol/L (ref 135–145)
Total Bilirubin: 0.9 mg/dL (ref 0.3–1.2)
Total Protein: 7.6 g/dL (ref 6.5–8.1)

## 2020-03-08 LAB — CBC
HCT: 43.7 % (ref 36.0–46.0)
Hemoglobin: 15 g/dL (ref 12.0–15.0)
MCH: 30.7 pg (ref 26.0–34.0)
MCHC: 34.3 g/dL (ref 30.0–36.0)
MCV: 89.4 fL (ref 80.0–100.0)
Platelets: 176 10*3/uL (ref 150–400)
RBC: 4.89 MIL/uL (ref 3.87–5.11)
RDW: 12.4 % (ref 11.5–15.5)
WBC: 6 10*3/uL (ref 4.0–10.5)
nRBC: 0 % (ref 0.0–0.2)

## 2020-03-08 LAB — TYPE AND SCREEN
ABO/RH(D): O POS
Antibody Screen: NEGATIVE

## 2020-03-08 LAB — SURGICAL PCR SCREEN
MRSA, PCR: NEGATIVE
Staphylococcus aureus: NEGATIVE

## 2020-03-08 LAB — PROTIME-INR
INR: 1 (ref 0.8–1.2)
Prothrombin Time: 12.3 seconds (ref 11.4–15.2)

## 2020-03-08 LAB — URINALYSIS, ROUTINE W REFLEX MICROSCOPIC
Bilirubin Urine: NEGATIVE
Glucose, UA: NEGATIVE mg/dL
Hgb urine dipstick: NEGATIVE
Ketones, ur: NEGATIVE mg/dL
Leukocytes,Ua: NEGATIVE
Nitrite: NEGATIVE
Protein, ur: NEGATIVE mg/dL
Specific Gravity, Urine: 1.009 (ref 1.005–1.030)
pH: 8 (ref 5.0–8.0)

## 2020-03-08 LAB — SEDIMENTATION RATE: Sed Rate: 10 mm/hr (ref 0–30)

## 2020-03-08 LAB — C-REACTIVE PROTEIN: CRP: 0.5 mg/dL (ref <1.0)

## 2020-03-08 LAB — APTT: aPTT: 29 seconds (ref 24–36)

## 2020-03-08 NOTE — Patient Instructions (Signed)
Your procedure is scheduled on: Wed. 12/29 Report to registration desk  Then 2nd floor Day Surgery To find out your arrival time please call [phone] between 1PM - 3PM on Tues 12.28.  Remember: Instructions that are not followed completely may result in serious medical risk,  up to and including death, or upon the discretion of your surgeon and anesthesiologist your  surgery may need to be rescheduled.     _X__ 1. Do not eat food after midnight the night before your procedure.                 No chewing gum or hard candies. You may drink clear liquids up to 2 hours                 before you are scheduled to arrive for your surgery- DO not drink clear                 liquids within 2 hours of the start of your surgery.                 Clear Liquids include:  water, apple juice without pulp, clear Gatorade, G2 or                  Gatorade Zero (avoid Red/Purple/Blue), Black Coffee or Tea (Do not add                 anything to coffee or tea). __x___2.   Complete the "Ensure Clear Pre-surgery Clear Carbohydrate Drink" provided to you, 2 hours before arrival.  __X__2.  On the morning of surgery brush your teeth with toothpaste and water, you                may rinse your mouth with mouthwash if you wish.  Do not swallow any toothpaste of mouthwash.     ___ 3.  No Alcohol for 24 hours before or after surgery.   ___ 4.  Do Not Smoke or use e-cigarettes For 24 Hours Prior to Your Surgery.                 Do not use any chewable tobacco products for at least 6 hours prior to                 Surgery.  ___  5.  Do not use any recreational drugs (marijuana, cocaine, heroin, ecstasy, MDMA or other)                For at least one week prior to your surgery.  Combination of these drugs with anesthesia                May have life threatening results.  ____  6.  Bring all medications with you on the day of surgery if instructed.   _x___  7.  Notify your doctor if there  is any change in your medical condition      (cold, fever, infections).     Do not wear jewelry, make-up, hairpins, clips or nail polish. Do not wear lotions, powders, or perfumes. You may wear deodorant. Do not shave 48 hours prior to surgery.  Do not bring valuables to the hospital.    [facility] is not responsible for any belongings or valuables.  Contacts, dentures or bridgework may not be worn into surgery. Leave your suitcase in the car. After surgery it may be brought to your room. For patients admitted to the hospital, discharge time  is determined by your treatment team.   Patients discharged the day of surgery will not be allowed to drive home.   Make arrangements for someone to be with you for the first 24 hours of your Same Day Discharge.    Please read over the following fact sheets that you were given:   Incentive spirometry    __x__ Take these medicines the morning of surgery with A SIP OF WATER:    1. acetaminophen (TYLENOL) 500 MG tablet if needed  2. FLUoxetine (PROZAC) 20 MG capsule  3.   4.  5.  6.  ____ Fleet Enema (as directed)   __x__ Use CHG Soap (or wipes) as directed  ____ Use Benzoyl Peroxide Gel as instructed  ____ Use inhalers on the day of surgery  ____ Stop metformin 2 days prior to surgery    ____ Take 1/2 of usual insulin dose the night before surgery. No insulin the morning          of surgery.   ____ Stop Coumadin/Plavix/aspirin on   _x___ Stop Anti-inflammatories on meloxicam (MOBIC) 15 MG tablet tomorrow   __x__ Stop supplements until after surgery.  fish oil-omega-3 fatty acids 1000 MG capsule tomorrow  ____ Bring C-Pap to the hospital.    If you have any questions regarding your pre-procedure instructions,  Please call Pre-admit Testing at [phone] [facility]

## 2020-03-09 LAB — URINE CULTURE
Culture: <10000 — AB
Special Requests: NORMAL

## 2020-03-13 NOTE — H&P (Signed)
ORTHOPAEDIC HISTORY & PHYSICAL Progress Notes Michelene Gardener, [place] - 03/08/2020 11:30 AM EST [facility] Chief Complaint:   Chief Complaint  Patient presents with  . Knee Pain  H & P LEFT KNEE   History of Present Illness:   Tina Petty is a 69 y.o. female that presents to clinic today for her preoperative history and evaluation. Patient presents unaccompanied. The patient is scheduled to undergo a left total knee arthroplasty on 03/16/20 by Dr. Ernest Pine. Her pain began many years ago. The pain is located primarily along the medial aspect of the knee. She describes her pain as worse with weightbearing and changes in the weather. She reports associated swelling and some giving way of the knee. She denies associated numbness or tingling, denies locking.   The patient's symptoms have progressed to the point that they decrease her quality of life. The patient has previously undergone conservative treatment including NSAIDS and injections to the knee without adequate control of her symptoms.  Patient has had previous lumbar surgery for spinal stenosis but denies any retained hardware. Denies significant cardiac history, history of blood clots. Patient does live alone but her son and daughter-in-law are neighbors.   Past Medical, Surgical, Family, Social History, Allergies, Medications:   Past Medical History:  Past Medical History:  Diagnosis Date  . Adenomatous colon polyp, unspecified  . Anxiety  . 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 Depression  . Hyperlipidemia  . Hypertension  . Obesity (BMI 35.0-39.9 without comorbidity), unspecified 10/03/2017  . Osteopenia 2019  . Primary osteoarthritis of left knee 06/26/2016   Past Surgical History:  Past Surgical History:  Procedure Laterality Date  . CESAREAN SECTION 1983, 1986  [facility]  . COLONOSCOPY 07/10/1999, 05/13/1995,  12/30/1992  [facility] (Father/Sister)  . COLONOSCOPY 10/22/2003  Adenomatous Polyp, [facility] (Father/Sister)  . COLONOSCOPY 04/04/2012, 02/12/2007  PH Adenomatous Polyp, [facility] (Father/Sister): [facility] 03/2017; Recall Ltr mailed 03/08/2017 (dh)  . COLONOSCOPY 06/05/2017  PH Adenomatous Polyp, [facility] (Father/Sister): [facility] 07/2022  . ENDOSCOPIC CARPAL TUNNEL RELEASE  w/ trigger finger release [facility]  . HYSTERECTOMY  Total  . Jaw Surgery 1993  [facility]  . LAMINECTOMY LUMBAR SPINE 03/14  . Open medial menisectomy Left 1976   Current Medications:  Current Outpatient Medications  Medication Sig Dispense Refill  . acetaminophen (TYLENOL) 500 MG tablet Take 500 mg by mouth once daily as needed  . amLODIPine-benazepril (LOTREL) 5-20 mg capsule TAKE 1 CAPSULE BY MOUTH EVERY DAY 90 capsule 1  . calcium carbonate-vitamin D3 (CALTRATE 600+D) 600 mg(1,500mg ) -200 unit tablet Take 1 tablet by mouth 2 (two) times daily with meals.  . cholecalciferol, vitamin D3, (VITAMIN D3) 125 mcg (5,000 unit) tablet Take 5,000 Units by mouth once daily  . docosahexanoic acid/epa (FISH OIL ORAL) Take 1,200 mg by mouth once daily.  Marland Kitchen FLUoxetine (PROZAC) 20 MG capsule TAKE 1 CAPSULE BY MOUTH EVERY DAY 90 capsule 1  . inulin (FIBER GUMMIES ORAL) Take 1 tablet by mouth 2 (two) times daily as needed  . meloxicam (MOBIC) 15 MG tablet TAKE 1 TABLET BY MOUTH EVERY DAY 90 tablet 1  . multivitamin with minerals tablet Take 1 tablet by mouth once daily  . rosuvastatin (CRESTOR) 5 MG tablet TAKE 1 TABLET BY MOUTH EVERY DAY 90 tablet 1  . acetaminophen (TYLENOL EXTRA STRENGTH ORAL) Take 1 tablet by mouth as needed  . aspirin 81 MG EC tablet Take  81 mg by mouth once daily.  Marland Kitchen docosahexaenoic acid-epa 120-180 mg Cap Take 1 caplet by mouth 2 (two) times daily  . POTASSIUM ORAL Take 1 tablet by mouth nightly   No current facility-administered medications for this visit.   Allergies:  Allergies  Allergen Reactions  . Pravastatin Muscle  Pain  Leg cramps   Social History:  Social History   Socioeconomic History  . Marital status: Widowed  Spouse name: Not on file  . Number of children: 2  . Years of education: 73  . Highest education level: High school graduate  Occupational History  . Occupation: Retired  . 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 Smoking status: Never Smoker  . Smokeless tobacco: Never Used  Vaping Use  . Vaping Use: Never used  Substance and Sexual Activity  . Alcohol use: No  Alcohol/week: 0.0 standard drinks  . Drug use: No  . Sexual activity: Not Currently  Partners: Male  Birth control/protection: None  Other Topics Concern  . Not on file  Social History Narrative  Husband left her after 30 yrs in 07/09/2009. Ex-husband died suddenly on 01-17-2015.  Living Situation: Both her sons Tenny Craw & Kennedy Bucker) live nearby with their families. Has ~1 yr Valetta Close that she lives with.   Social Determinants of Health   Financial Resource Strain: Not on file  Food Insecurity: Not on file  Transportation Needs: Not on file  Physical Activity: Not on file  Stress: Not on file  Social Connections: Not on file  Housing Stability: Not on file   Family History:  Family History  Problem Relation Age of Onset  . Myocardial Infarction (Heart attack) Mother  . Colon cancer Father  . Diabetes type II Father  . Colon cancer Sister  . Rectal cancer Sister  . Myocardial Infarction (Heart attack) Paternal Aunt  . Diabetes type II Paternal Grandfather  . Myocardial Infarction (Heart attack) Paternal Grandfather  . Breast cancer Cousin   Review of Systems:   A 10+ ROS was performed, reviewed, and the pertinent orthopaedic findings are documented in the HPI.   Physical Examination:   BP 120/70  Ht 165.1 cm (5\' 5" )  Wt 93.1 kg (205 lb 3.2 oz)  BMI 34.15 kg/m   Patient is a well-developed, well-nourished female in no acute distress. Patient has normal mood and affect. Patient is alert  and oriented to person, place, and time.   HEENT: Atraumatic, normocephalic. Pupils equal and reactive to light. Extraocular motion intact. Noninjected sclera.  Cardiovascular: Regular rate and rhythm, with no murmurs, rubs, or gallops. Distal pulses palpable. No bruits.  Respiratory: Lungs clear to auscultation bilaterally.   Left Knee: Soft tissue swelling: mild Effusion: minimal Erythema: none Crepitance: mild Tenderness: medial Alignment: relative varus Mediolateral laxity: medial pseudolaxity Posterior sag: negative Patellar tracking: Good tracking without evidence of subluxation or tilt Atrophy: No significant atrophy.  Quadriceps tone was fair to good. Range of motion: 0/4/122 degrees   Sensation intact over the saphenous, lateral sural cutaneous, superficial fibular, and deep fibular nerve distributions.  Tests Performed/Reviewed:  X-rays  No new radiographs were obtained today. Previous radiographs were reviewed of the left knee and revealed complete loss of medial compartment joint space with bone-on-bone contact and subchondral sclerosis of the bone noted. Lateral compartment reveals mild loss of joint space with osteophyte formation. Loss of patellofemoral joint space with osteophyte formation is noted. Mild to moderate spurring of the patella noted. No fractures.  Impression:   ICD-10-CM  1. Primary osteoarthritis of left knee M17.12   Plan:   The patient has end-stage degenerative changes of the left knee. It was explained to the patient that the condition is progressive in nature. Having failed conservative treatment, the patient has elected to proceed with a total joint arthroplasty. The patient will undergo a total joint arthroplasty with Dr. Ernest Pine. The risks of surgery, including blood clot and infection, were discussed with the patient. Measures to reduce these risks, including the use of anticoagulation, perioperative antibiotics, and early ambulation were  discussed. The importance of postoperative physical therapy was discussed with the patient. The patient elects to proceed with surgery. The patient is instructed to stop all blood thinners prior to surgery. The patient is instructed to call the hospital the day before surgery to learn of the proper arrival time.   Contact our office with any questions or concerns. Follow up as indicated, or sooner should any new problems arise, if conditions worsen, or if they are otherwise concerned.   Michelene Gardener, PA-C [facility] [address] [place], [place] [address] Phone: [phone]  This note was generated in part with voice recognition software and I apologize for any typographical errors that were not detected and corrected.   Electronically signed by Michelene Gardener, PA at 03/08/2020 12:22 PM EST

## 2020-03-14 ENCOUNTER — Other Ambulatory Visit: Payer: Self-pay

## 2020-03-14 ENCOUNTER — Other Ambulatory Visit
Admission: RE | Admit: 2020-03-14 | Discharge: 2020-03-14 | Disposition: A | Discharge status: Home / Self Care | Payer: Medicare Other | Source: Ambulatory Visit | Attending: Orthopedic Surgery | Admitting: Orthopedic Surgery

## 2020-03-14 DIAGNOSIS — Z20822 Contact with and (suspected) exposure to covid-19: Secondary | ICD-10-CM | POA: Insufficient documentation

## 2020-03-14 DIAGNOSIS — Z01812 Encounter for preprocedural laboratory examination: Secondary | ICD-10-CM | POA: Insufficient documentation

## 2020-03-15 ENCOUNTER — Encounter: Payer: Self-pay | Admitting: Orthopedic Surgery

## 2020-03-15 DIAGNOSIS — F419 Anxiety disorder, unspecified: Secondary | ICD-10-CM | POA: Insufficient documentation

## 2020-03-15 DIAGNOSIS — E785 Hyperlipidemia, unspecified: Secondary | ICD-10-CM | POA: Insufficient documentation

## 2020-03-15 DIAGNOSIS — I10 Essential (primary) hypertension: Secondary | ICD-10-CM | POA: Insufficient documentation

## 2020-03-15 DIAGNOSIS — F32A Depression, unspecified: Secondary | ICD-10-CM | POA: Insufficient documentation

## 2020-03-15 LAB — SARS CORONAVIRUS 2 (TAT 6-24 HRS): SARS Coronavirus 2: NEGATIVE

## 2020-03-16 ENCOUNTER — Inpatient Hospital Stay: Payer: Medicare Other | Admitting: Urgent Care

## 2020-03-16 ENCOUNTER — Inpatient Hospital Stay: Payer: Medicare Other

## 2020-03-16 ENCOUNTER — Encounter
Admission: RE | Disposition: A | Discharge status: Home Health | Payer: Self-pay | Source: Home / Self Care | Attending: Orthopedic Surgery

## 2020-03-16 ENCOUNTER — Inpatient Hospital Stay
Admission: RE | Admit: 2020-03-16 | Discharge: 2020-03-18 | DRG: 470 | Disposition: A | Discharge status: Home Health | Payer: Medicare Other | Attending: Orthopedic Surgery | Admitting: Orthopedic Surgery

## 2020-03-16 ENCOUNTER — Inpatient Hospital Stay: Payer: Medicare Other | Admitting: Anesthesiology

## 2020-03-16 ENCOUNTER — Other Ambulatory Visit: Payer: Self-pay

## 2020-03-16 ENCOUNTER — Encounter: Payer: Self-pay | Admitting: Orthopedic Surgery

## 2020-03-16 DIAGNOSIS — M25762 Osteophyte, left knee: Secondary | ICD-10-CM | POA: Diagnosis present

## 2020-03-16 DIAGNOSIS — E785 Hyperlipidemia, unspecified: Secondary | ICD-10-CM | POA: Diagnosis present

## 2020-03-16 DIAGNOSIS — I10 Essential (primary) hypertension: Secondary | ICD-10-CM | POA: Diagnosis present

## 2020-03-16 DIAGNOSIS — M8580 Other specified disorders of bone density and structure, unspecified site: Secondary | ICD-10-CM | POA: Diagnosis present

## 2020-03-16 DIAGNOSIS — M1712 Unilateral primary osteoarthritis, left knee: Secondary | ICD-10-CM | POA: Diagnosis present

## 2020-03-16 DIAGNOSIS — F32A Depression, unspecified: Secondary | ICD-10-CM | POA: Diagnosis present

## 2020-03-16 DIAGNOSIS — Z90710 Acquired absence of both cervix and uterus: Secondary | ICD-10-CM

## 2020-03-16 DIAGNOSIS — Z20822 Contact with and (suspected) exposure to covid-19: Secondary | ICD-10-CM | POA: Diagnosis present

## 2020-03-16 DIAGNOSIS — F419 Anxiety disorder, unspecified: Secondary | ICD-10-CM | POA: Diagnosis present

## 2020-03-16 DIAGNOSIS — Z8249 Family history of ischemic heart disease and other diseases of the circulatory system: Secondary | ICD-10-CM | POA: Diagnosis not present

## 2020-03-16 DIAGNOSIS — Z96659 Presence of unspecified artificial knee joint: Secondary | ICD-10-CM

## 2020-03-16 HISTORY — PX: KNEE ARTHROPLASTY: SHX992

## 2020-03-16 LAB — ABO/RH: ABO/RH(D): O POS

## 2020-03-16 SURGERY — ARTHROPLASTY, KNEE, TOTAL, USING IMAGELESS COMPUTER-ASSISTED NAVIGATION
Anesthesia: Spinal | Site: Knee | Laterality: Left

## 2020-03-16 MED ORDER — FENTANYL CITRATE (PF) 100 MCG/2ML IJ SOLN
INTRAMUSCULAR | Status: AC
  Filled 2020-03-16: qty 2

## 2020-03-16 MED ORDER — TRANEXAMIC ACID-NACL 1000-0.7 MG/100ML-% IV SOLN
INTRAVENOUS | Status: AC
  Filled 2020-03-16: qty 100

## 2020-03-16 MED ORDER — ENOXAPARIN SODIUM 30 MG/0.3ML ~~LOC~~ SOLN
30.0000 mg | Freq: Two times a day (BID) | SUBCUTANEOUS | Status: DC
  Administered 2020-03-17 – 2020-03-18 (×3): 30 mg via SUBCUTANEOUS
  Filled 2020-03-16 (×3): qty 0.3

## 2020-03-16 MED ORDER — NEOMYCIN-POLYMYXIN B GU 40-200000 IR SOLN
Status: DC | PRN
  Administered 2020-03-16: 16 mL

## 2020-03-16 MED ORDER — GABAPENTIN 300 MG PO CAPS: 300.0000 mg | ORAL_CAPSULE | Freq: Once | ORAL | Status: AC

## 2020-03-16 MED ORDER — MAGNESIUM HYDROXIDE 400 MG/5ML PO SUSP
30.0000 mL | Freq: Every day | ORAL | Status: DC
  Administered 2020-03-17: 09:00:00 30 mL via ORAL
  Filled 2020-03-16: qty 30

## 2020-03-16 MED ORDER — FERROUS SULFATE 325 (65 FE) MG PO TABS
325.0000 mg | ORAL_TABLET | Freq: Two times a day (BID) | ORAL | Status: DC
  Administered 2020-03-17 – 2020-03-18 (×3): 325 mg via ORAL
  Filled 2020-03-16 (×3): qty 1

## 2020-03-16 MED ORDER — MIDAZOLAM HCL 2 MG/2ML IJ SOLN
INTRAMUSCULAR | Status: AC
  Filled 2020-03-16: qty 4

## 2020-03-16 MED ORDER — ALUM & MAG HYDROXIDE-SIMETH 200-200-20 MG/5ML PO SUSP: 30.0000 mL | ORAL | Status: DC | PRN

## 2020-03-16 MED ORDER — CEFAZOLIN SODIUM-DEXTROSE 2-4 GM/100ML-% IV SOLN
2.0000 g | INTRAVENOUS | Status: AC
  Administered 2020-03-16: 12:00:00 2 g via INTRAVENOUS

## 2020-03-16 MED ORDER — ONDANSETRON HCL 4 MG/2ML IJ SOLN
4.0000 mg | Freq: Four times a day (QID) | INTRAMUSCULAR | Status: DC | PRN
  Administered 2020-03-17: 10:00:00 4 mg via INTRAVENOUS
  Filled 2020-03-16: qty 2

## 2020-03-16 MED ORDER — DEXAMETHASONE SODIUM PHOSPHATE 10 MG/ML IJ SOLN: 8.0000 mg | Freq: Once | INTRAMUSCULAR | Status: AC

## 2020-03-16 MED ORDER — GABAPENTIN 300 MG PO CAPS
ORAL_CAPSULE | ORAL | Status: AC
  Administered 2020-03-16: 10:00:00 300 mg via ORAL
  Filled 2020-03-16: qty 1

## 2020-03-16 MED ORDER — SODIUM CHLORIDE 0.9 % IV SOLN
INTRAVENOUS | Status: DC | PRN
  Administered 2020-03-16: 15:00:00 60 mL

## 2020-03-16 MED ORDER — CEFAZOLIN SODIUM-DEXTROSE 2-4 GM/100ML-% IV SOLN
2.0000 g | Freq: Four times a day (QID) | INTRAVENOUS | Status: AC
  Administered 2020-03-16: 19:00:00 2 g via INTRAVENOUS
  Filled 2020-03-16 (×2): qty 100

## 2020-03-16 MED ORDER — SURGIPHOR WOUND IRRIGATION SYSTEM - OPTIME
TOPICAL | Status: DC | PRN
  Administered 2020-03-16: 13:00:00 450 mL via TOPICAL

## 2020-03-16 MED ORDER — ACETAMINOPHEN 10 MG/ML IV SOLN
INTRAVENOUS | Status: DC | PRN
  Administered 2020-03-16: 1000 mg via INTRAVENOUS

## 2020-03-16 MED ORDER — ONDANSETRON HCL 4 MG/2ML IJ SOLN: 4.0000 mg | Freq: Once | INTRAMUSCULAR | Status: DC | PRN

## 2020-03-16 MED ORDER — PROPOFOL 500 MG/50ML IV EMUL
INTRAVENOUS | Status: DC | PRN
  Administered 2020-03-16: 50 ug/kg/min via INTRAVENOUS

## 2020-03-16 MED ORDER — CHLORHEXIDINE GLUCONATE 4 % EX LIQD: 60.0000 mL | Freq: Once | CUTANEOUS | Status: DC

## 2020-03-16 MED ORDER — FLEET ENEMA 7-19 GM/118ML RE ENEM: 1.0000 | ENEMA | Freq: Once | RECTAL | Status: DC | PRN

## 2020-03-16 MED ORDER — CHLORHEXIDINE GLUCONATE 0.12 % MT SOLN: 15.0000 mL | Freq: Once | OROMUCOSAL | Status: DC

## 2020-03-16 MED ORDER — PROPOFOL 10 MG/ML IV BOLUS
INTRAVENOUS | Status: DC | PRN
  Administered 2020-03-16: 50 mg via INTRAVENOUS

## 2020-03-16 MED ORDER — DIPHENHYDRAMINE HCL 12.5 MG/5ML PO ELIX: 12.5000 mg | ORAL_SOLUTION | ORAL | Status: DC | PRN

## 2020-03-16 MED ORDER — PHENOL 1.4 % MT LIQD
1.0000 | OROMUCOSAL | Status: DC | PRN
  Filled 2020-03-16: qty 177

## 2020-03-16 MED ORDER — ACETAMINOPHEN 10 MG/ML IV SOLN
1000.0000 mg | Freq: Four times a day (QID) | INTRAVENOUS | Status: AC
  Administered 2020-03-16 – 2020-03-17 (×4): 1000 mg via INTRAVENOUS
  Filled 2020-03-16 (×4): qty 100

## 2020-03-16 MED ORDER — FAMOTIDINE 20 MG PO TABS
ORAL_TABLET | ORAL | Status: AC
  Administered 2020-03-16: 10:00:00 20 mg via ORAL
  Filled 2020-03-16: qty 1

## 2020-03-16 MED ORDER — VITAMIN D 25 MCG (1000 UNIT) PO TABS
2000.0000 [IU] | ORAL_TABLET | Freq: Every day | ORAL | Status: DC
  Administered 2020-03-17 – 2020-03-18 (×2): 2000 [IU] via ORAL
  Filled 2020-03-16 (×2): qty 2

## 2020-03-16 MED ORDER — LACTATED RINGERS IV SOLN: INTRAVENOUS | Status: DC

## 2020-03-16 MED ORDER — CALCIUM CARBONATE-VITAMIN D 500-200 MG-UNIT PO TABS
1.0000 | ORAL_TABLET | Freq: Two times a day (BID) | ORAL | Status: DC
  Administered 2020-03-16 – 2020-03-18 (×4): 1 via ORAL
  Filled 2020-03-16 (×4): qty 1

## 2020-03-16 MED ORDER — CELECOXIB 200 MG PO CAPS
ORAL_CAPSULE | ORAL | Status: AC
  Administered 2020-03-16: 10:00:00 400 mg via ORAL
  Filled 2020-03-16: qty 2

## 2020-03-16 MED ORDER — CELECOXIB 200 MG PO CAPS: 400.0000 mg | ORAL_CAPSULE | Freq: Once | ORAL | Status: AC

## 2020-03-16 MED ORDER — ONDANSETRON HCL 4 MG/2ML IJ SOLN
INTRAMUSCULAR | Status: DC | PRN
  Administered 2020-03-16: 4 mg via INTRAVENOUS

## 2020-03-16 MED ORDER — PANTOPRAZOLE SODIUM 40 MG PO TBEC
40.0000 mg | DELAYED_RELEASE_TABLET | Freq: Two times a day (BID) | ORAL | Status: DC
  Administered 2020-03-16 – 2020-03-18 (×4): 40 mg via ORAL
  Filled 2020-03-16 (×4): qty 1

## 2020-03-16 MED ORDER — CEFAZOLIN SODIUM-DEXTROSE 2-4 GM/100ML-% IV SOLN
INTRAVENOUS | Status: AC
  Administered 2020-03-17: 2 g via INTRAVENOUS
  Filled 2020-03-16: qty 100

## 2020-03-16 MED ORDER — TRAMADOL HCL 50 MG PO TABS
50.0000 mg | ORAL_TABLET | ORAL | Status: DC | PRN
Start: 2020-03-16 — End: 2020-03-18
  Administered 2020-03-17: 50 mg via ORAL
  Filled 2020-03-16: qty 1

## 2020-03-16 MED ORDER — FENTANYL CITRATE (PF) 100 MCG/2ML IJ SOLN
25.0000 ug | INTRAMUSCULAR | Status: DC | PRN
Start: 2020-03-16 — End: 2020-03-16
  Administered 2020-03-16: 25 ug via INTRAVENOUS

## 2020-03-16 MED ORDER — ENSURE PRE-SURGERY PO LIQD
296.0000 mL | Freq: Once | ORAL | Status: DC
  Filled 2020-03-16: qty 296

## 2020-03-16 MED ORDER — BUPIVACAINE HCL (PF) 0.25 % IJ SOLN
INTRAMUSCULAR | Status: DC | PRN
  Administered 2020-03-16: 60 mL

## 2020-03-16 MED ORDER — FAMOTIDINE 20 MG PO TABS: 20.0000 mg | ORAL_TABLET | Freq: Once | ORAL | Status: AC

## 2020-03-16 MED ORDER — GABAPENTIN 300 MG PO CAPS
300.0000 mg | ORAL_CAPSULE | Freq: Every day | ORAL | Status: DC
  Administered 2020-03-16 – 2020-03-17 (×2): 300 mg via ORAL
  Filled 2020-03-16 (×2): qty 1

## 2020-03-16 MED ORDER — AMLODIPINE BESYLATE 5 MG PO TABS
5.0000 mg | ORAL_TABLET | Freq: Every day | ORAL | Status: DC
  Administered 2020-03-17 – 2020-03-18 (×2): 5 mg via ORAL
  Filled 2020-03-16 (×2): qty 1

## 2020-03-16 MED ORDER — PROPOFOL 500 MG/50ML IV EMUL
INTRAVENOUS | Status: AC
  Filled 2020-03-16: qty 50

## 2020-03-16 MED ORDER — SODIUM CHLORIDE 0.9 % IV SOLN
INTRAVENOUS | Status: DC | PRN
  Administered 2020-03-16: 13:00:00 15 ug/min via INTRAVENOUS

## 2020-03-16 MED ORDER — ACETAMINOPHEN 10 MG/ML IV SOLN
INTRAVENOUS | Status: AC
  Filled 2020-03-16: qty 100

## 2020-03-16 MED ORDER — OMEGA-3-ACID ETHYL ESTERS 1 G PO CAPS
1.0000 g | ORAL_CAPSULE | Freq: Every day | ORAL | Status: DC
  Administered 2020-03-17 – 2020-03-18 (×2): 1 g via ORAL
  Filled 2020-03-16 (×2): qty 1

## 2020-03-16 MED ORDER — FLUOXETINE HCL 20 MG PO CAPS
20.0000 mg | ORAL_CAPSULE | Freq: Every day | ORAL | Status: DC
Start: 2020-03-16 — End: 2020-03-18
  Administered 2020-03-17 – 2020-03-18 (×2): 20 mg via ORAL
  Filled 2020-03-16 (×3): qty 1

## 2020-03-16 MED ORDER — CALCIUM CARB-CHOLECALCIFEROL 600-800 MG-UNIT PO TABS: 1.0000 | ORAL_TABLET | Freq: Two times a day (BID) | ORAL | Status: DC

## 2020-03-16 MED ORDER — TRANEXAMIC ACID-NACL 1000-0.7 MG/100ML-% IV SOLN
1000.0000 mg | Freq: Once | INTRAVENOUS | Status: AC
  Administered 2020-03-16: 16:00:00 1000 mg via INTRAVENOUS

## 2020-03-16 MED ORDER — ORAL CARE MOUTH RINSE: 15.0000 mL | Freq: Once | OROMUCOSAL | Status: DC

## 2020-03-16 MED ORDER — METOCLOPRAMIDE HCL 10 MG PO TABS
10.0000 mg | ORAL_TABLET | Freq: Three times a day (TID) | ORAL | Status: DC
  Administered 2020-03-16 – 2020-03-18 (×6): 10 mg via ORAL
  Filled 2020-03-16 (×6): qty 1

## 2020-03-16 MED ORDER — SENNOSIDES-DOCUSATE SODIUM 8.6-50 MG PO TABS
1.0000 | ORAL_TABLET | Freq: Two times a day (BID) | ORAL | Status: DC
  Administered 2020-03-16 – 2020-03-17 (×3): 1 via ORAL
  Filled 2020-03-16 (×3): qty 1

## 2020-03-16 MED ORDER — ADULT MULTIVITAMIN W/MINERALS CH
1.0000 | ORAL_TABLET | ORAL | Status: DC
  Administered 2020-03-18: 1 via ORAL

## 2020-03-16 MED ORDER — HYDROMORPHONE HCL 1 MG/ML IJ SOLN
0.5000 mg | INTRAMUSCULAR | Status: DC | PRN
Start: 2020-03-16 — End: 2020-03-18

## 2020-03-16 MED ORDER — ONDANSETRON HCL 4 MG PO TABS
4.0000 mg | ORAL_TABLET | Freq: Four times a day (QID) | ORAL | Status: DC | PRN
  Administered 2020-03-16: 18:00:00 4 mg via ORAL
  Filled 2020-03-16: qty 1

## 2020-03-16 MED ORDER — ROSUVASTATIN CALCIUM 5 MG PO TABS
5.0000 mg | ORAL_TABLET | Freq: Every day | ORAL | Status: DC
  Administered 2020-03-17 – 2020-03-18 (×2): 5 mg via ORAL
  Filled 2020-03-16 (×2): qty 1

## 2020-03-16 MED ORDER — AMLODIPINE BESY-BENAZEPRIL HCL 5-20 MG PO CAPS: 1.0000 | ORAL_CAPSULE | Freq: Every day | ORAL | Status: DC

## 2020-03-16 MED ORDER — TRANEXAMIC ACID-NACL 1000-0.7 MG/100ML-% IV SOLN
1000.0000 mg | INTRAVENOUS | Status: AC
  Administered 2020-03-16: 13:00:00 1000 mg via INTRAVENOUS

## 2020-03-16 MED ORDER — MIDAZOLAM HCL 5 MG/5ML IJ SOLN
INTRAMUSCULAR | Status: DC | PRN
  Administered 2020-03-16 (×4): 1 mg via INTRAVENOUS

## 2020-03-16 MED ORDER — CELECOXIB 200 MG PO CAPS
200.0000 mg | ORAL_CAPSULE | Freq: Two times a day (BID) | ORAL | Status: DC
  Administered 2020-03-16 – 2020-03-18 (×4): 200 mg via ORAL
  Filled 2020-03-16 (×4): qty 1

## 2020-03-16 MED ORDER — DEXAMETHASONE SODIUM PHOSPHATE 10 MG/ML IJ SOLN
INTRAMUSCULAR | Status: AC
  Administered 2020-03-16: 10:00:00 8 mg via INTRAVENOUS
  Filled 2020-03-16: qty 1

## 2020-03-16 MED ORDER — ACETAMINOPHEN 325 MG PO TABS
325.0000 mg | ORAL_TABLET | Freq: Four times a day (QID) | ORAL | Status: DC | PRN
Start: 2020-03-16 — End: 2020-03-18
  Administered 2020-03-17 – 2020-03-18 (×2): 650 mg via ORAL
  Filled 2020-03-16 (×2): qty 2

## 2020-03-16 MED ORDER — BISACODYL 10 MG RE SUPP: 10.0000 mg | Freq: Every day | RECTAL | Status: DC | PRN

## 2020-03-16 MED ORDER — SODIUM CHLORIDE 0.9 % IV SOLN: INTRAVENOUS | Status: DC

## 2020-03-16 MED ORDER — BENAZEPRIL HCL 20 MG PO TABS
20.0000 mg | ORAL_TABLET | Freq: Every day | ORAL | Status: DC
  Administered 2020-03-17 – 2020-03-18 (×2): 20 mg via ORAL
  Filled 2020-03-16 (×3): qty 1

## 2020-03-16 MED ORDER — MENTHOL 3 MG MT LOZG
1.0000 | LOZENGE | OROMUCOSAL | Status: DC | PRN
  Filled 2020-03-16: qty 9

## 2020-03-16 MED ORDER — CHLORHEXIDINE GLUCONATE 0.12 % MT SOLN
OROMUCOSAL | Status: AC
  Filled 2020-03-16: qty 15

## 2020-03-16 MED ORDER — OXYCODONE HCL 5 MG PO TABS
5.0000 mg | ORAL_TABLET | ORAL | Status: DC | PRN
  Administered 2020-03-17: 5 mg via ORAL
  Filled 2020-03-16: qty 1

## 2020-03-16 MED ORDER — BUPIVACAINE HCL (PF) 0.5 % IJ SOLN
INTRAMUSCULAR | Status: DC | PRN
  Administered 2020-03-16: 3 mL

## 2020-03-16 SURGICAL SUPPLY — 76 items
ATTUNE PSFEM LTSZ6 NARCEM KNEE (Femur) ×1 IMPLANT
ATTUNE PSRP INSR SZ6 5 KNEE (Insert) ×1 IMPLANT
BASE TIBIAL ROT PLAT SZ 5 KNEE (Knees) IMPLANT
BATTERY INSTRU NAVIGATION (MISCELLANEOUS) ×8 IMPLANT
BLADE SAW 70X12.5 (BLADE) ×2 IMPLANT
BLADE SAW 90X13X1.19 OSCILLAT (BLADE) ×2 IMPLANT
BLADE SAW 90X25X1.19 OSCILLAT (BLADE) ×2 IMPLANT
BSPLAT TIB 5 CMNT ROT PLAT STR (Knees) ×1 IMPLANT
BTRY SRG DRVR LF (MISCELLANEOUS) ×4
CEMENT HV SMART SET (Cement) ×2 IMPLANT
COOLER POLAR GLACIER W/PUMP (MISCELLANEOUS) ×2 IMPLANT
COVER WAND RF STERILE (DRAPES) ×2 IMPLANT
CUFF TOURN SGL QUICK 24 (TOURNIQUET CUFF)
CUFF TOURN SGL QUICK 30 (TOURNIQUET CUFF) ×2
CUFF TRNQT CYL 24X4X16.5-23 (TOURNIQUET CUFF) IMPLANT
CUFF TRNQT CYL 30X4X21-28X (TOURNIQUET CUFF) IMPLANT
DRAPE 3/4 80X56 (DRAPES) ×2 IMPLANT
DRSG DERMACEA 8X12 NADH (GAUZE/BANDAGES/DRESSINGS) ×2 IMPLANT
DRSG MEPILEX SACRM 8.7X9.8 (GAUZE/BANDAGES/DRESSINGS) ×2 IMPLANT
DRSG OPSITE POSTOP 4X14 (GAUZE/BANDAGES/DRESSINGS) ×2 IMPLANT
DRSG TEGADERM 4X4.75 (GAUZE/BANDAGES/DRESSINGS) ×2 IMPLANT
DURAPREP 26ML APPLICATOR (WOUND CARE) ×4 IMPLANT
ELECT REM PT RETURN 9FT ADLT (ELECTROSURGICAL) ×2
ELECTRODE REM PT RTRN 9FT ADLT (ELECTROSURGICAL) ×1 IMPLANT
EX-PIN ORTHOLOCK NAV 4X150 (PIN) ×4 IMPLANT
GLOVE BIO SURGEON STRL SZ7.5 (GLOVE) ×4 IMPLANT
GLOVE BIOGEL M STRL SZ7.5 (GLOVE) ×4 IMPLANT
GLOVE BIOGEL PI IND STRL 7.5 (GLOVE) ×1 IMPLANT
GLOVE BIOGEL PI INDICATOR 7.5 (GLOVE) ×1
GLOVE INDICATOR 8.0 STRL GRN (GLOVE) ×2 IMPLANT
GOWN STRL REUS W/ TWL LRG LVL3 (GOWN DISPOSABLE) ×2 IMPLANT
GOWN STRL REUS W/ TWL XL LVL3 (GOWN DISPOSABLE) ×1 IMPLANT
GOWN STRL REUS W/TWL LRG LVL3 (GOWN DISPOSABLE) ×4
GOWN STRL REUS W/TWL XL LVL3 (GOWN DISPOSABLE) ×2
HEMOVAC 400CC 10FR (MISCELLANEOUS) ×2 IMPLANT
HOLDER FOLEY CATH W/STRAP (MISCELLANEOUS) ×2 IMPLANT
HOOD PEEL AWAY FLYTE STAYCOOL (MISCELLANEOUS) ×4 IMPLANT
IRRIGATION SURGIPHOR STRL (IV SOLUTION) ×2 IMPLANT
KIT TURNOVER KIT A (KITS) ×2 IMPLANT
KNIFE SCULPS 14X20 (INSTRUMENTS) ×2 IMPLANT
LABEL OR SOLS (LABEL) ×2 IMPLANT
MANIFOLD NEPTUNE II (INSTRUMENTS) ×4 IMPLANT
NDL SAFETY ECLIPSE 18X1.5 (NEEDLE) ×1 IMPLANT
NDL SPNL 20GX3.5 QUINCKE YW (NEEDLE) ×2 IMPLANT
NEEDLE HYPO 18GX1.5 SHARP (NEEDLE) ×2
NEEDLE SPNL 20GX3.5 QUINCKE YW (NEEDLE) ×4 IMPLANT
NS IRRIG 1000ML POUR BTL (IV SOLUTION) ×1 IMPLANT
PACK TOTAL KNEE (MISCELLANEOUS) ×2 IMPLANT
PAD WRAPON POLAR KNEE (MISCELLANEOUS) ×1 IMPLANT
PATELLA MEDIAL ATTUN 35MM KNEE (Knees) ×1 IMPLANT
PENCIL SMOKE ULTRAEVAC 22 CON (MISCELLANEOUS) ×2 IMPLANT
PIN DRILL QUICK PACK ×2 IMPLANT
PIN FIXATION 1/8DIA X 3INL (PIN) ×6 IMPLANT
PULSAVAC PLUS IRRIG FAN TIP (DISPOSABLE) ×2
SOL .9 NS 3000ML IRR  AL (IV SOLUTION) ×2
SOL .9 NS 3000ML IRR AL (IV SOLUTION) ×1
SOL .9 NS 3000ML IRR UROMATIC (IV SOLUTION) ×1 IMPLANT
SOL PREP PVP 2OZ (MISCELLANEOUS) ×2
SOLUTION PREP PVP 2OZ (MISCELLANEOUS) ×1 IMPLANT
SPONGE DRAIN TRACH 4X4 STRL 2S (GAUZE/BANDAGES/DRESSINGS) ×2 IMPLANT
STAPLER SKIN PROX 35W (STAPLE) ×2 IMPLANT
STOCKINETTE IMPERV 14X48 (MISCELLANEOUS) ×1 IMPLANT
STRAP TIBIA SHORT (MISCELLANEOUS) ×2 IMPLANT
SUCTION FRAZIER HANDLE 10FR (MISCELLANEOUS) ×2
SUCTION TUBE FRAZIER 10FR DISP (MISCELLANEOUS) ×1 IMPLANT
SUT VIC AB 0 CT1 36 (SUTURE) ×4 IMPLANT
SUT VIC AB 1 CT1 36 (SUTURE) ×4 IMPLANT
SUT VIC AB 2-0 CT2 27 (SUTURE) ×2 IMPLANT
SYR 20ML LL LF (SYRINGE) ×2 IMPLANT
SYR 30ML LL (SYRINGE) ×4 IMPLANT
TIBIAL BASE ROT PLAT SZ 5 KNEE (Knees) ×2 IMPLANT
TIP FAN IRRIG PULSAVAC PLUS (DISPOSABLE) ×1 IMPLANT
TOWEL OR 17X26 4PK STRL BLUE (TOWEL DISPOSABLE) ×2 IMPLANT
TOWER CARTRIDGE SMART MIX (DISPOSABLE) ×2 IMPLANT
TRAY FOLEY MTR SLVR 16FR STAT (SET/KITS/TRAYS/PACK) ×2 IMPLANT
WRAPON POLAR PAD KNEE (MISCELLANEOUS) ×2

## 2020-03-16 NOTE — Anesthesia Procedure Notes (Signed)
Spinal  Patient location during procedure: OR Start time: 03/16/2020 12:13 PM End time: 03/16/2020 12:20 PM Staffing Performed: resident/CRNA  Resident/CRNA: [name], CRNA Preanesthetic Checklist Completed: patient identified, IV checked, site marked, risks and benefits discussed, surgical consent, monitors and equipment checked, pre-op evaluation and timeout performed Spinal Block Patient position: sitting Prep: Betadine Patient monitoring: heart rate, continuous pulse ox, blood pressure and cardiac monitor Approach: midline Location: L3-4 Injection technique: single-shot Needle Needle type: Whitacre and Introducer  Needle gauge: 25 G Needle length: 9 cm Assessment Sensory level: T10 Additional Notes Negative paresthesia. Negative blood return. Positive free-flowing CSF. Expiration date of kit checked and confirmed. Patient tolerated procedure well, without complications.

## 2020-03-16 NOTE — H&P (Signed)
The patient has been re-examined, and the chart reviewed, and there have been no interval changes to the documented history and physical.    The risks, benefits, and alternatives have been discussed at length. The patient expressed understanding of the risks benefits and agreed with plans for surgical intervention.  Hussam Muniz P. Tramon Crescenzo, Jr. M.D.    

## 2020-03-16 NOTE — Transfer of Care (Signed)
Immediate Anesthesia Transfer of Care Note  Patient: ARLETHIA BASSO  Procedure(s) Performed: COMPUTER ASSISTED TOTAL KNEE ARTHROPLASTY (Left Knee)  Patient Location: PACU  Anesthesia Type:Spinal  Level of Consciousness: awake, alert  and oriented  Airway & Oxygen Therapy: Patient Spontanous Breathing  Post-op Assessment: Report given to RN and Post -op Vital signs reviewed and stable  Post vital signs: Reviewed and stable  Last Vitals:  Vitals Value Taken Time  BP 138/63 03/16/20 1602  Temp    Pulse 68 03/16/20 1606  Resp 13 03/16/20 1606  SpO2 93 % 03/16/20 1606  Vitals shown include unvalidated device data.  Last Pain:  Vitals:   03/16/20 0947  TempSrc: Oral  PainSc: 5          Complications: No complications documented.

## 2020-03-16 NOTE — Anesthesia Preprocedure Evaluation (Signed)
Anesthesia Evaluation  Patient identified by MRN, date of birth, ID band Patient awake    Reviewed: Allergy & Precautions, H&P , NPO status , Patient's Chart, lab work & pertinent test results, reviewed documented beta blocker date and time   History of Anesthesia Complications (+) PONV and history of anesthetic complications  Airway Mallampati: II   Neck ROM: full    Dental  (+) Poor Dentition, Teeth Intact   Pulmonary neg pulmonary ROS,    Pulmonary exam normal        Cardiovascular Exercise Tolerance: Poor hypertension, On Medications negative cardio ROS Normal cardiovascular exam Rhythm:regular Rate:Normal     Neuro/Psych PSYCHIATRIC DISORDERS Anxiety Depression negative neurological ROS     GI/Hepatic negative GI ROS, Neg liver ROS,   Endo/Other  negative endocrine ROS  Renal/GU negative Renal ROS  negative genitourinary   Musculoskeletal   Abdominal   Peds  Hematology negative hematology ROS (+)   Anesthesia Other Findings Past Medical History: No date: Arthritis No date: Depression No date: Hx of dizziness No date: Hyperlipidemia No date: Hypertension No date: PONV (postoperative nausea and vomiting)     Comment:  not recently Past Surgical History: No date: ABDOMINAL HYSTERECTOMY No date: adenomatours colon polyp 1990: BREAST BIOPSY; Right     Comment:  neg- papiloma No date: BREAST LUMPECTOMY; Right     Comment:  papilloma No date: carpel tunnel surgery; Left No date: CESAREAN SECTION No date: chronic back pain unspecified     Comment:  severe multilevel spondylitic stenosis 06/05/2017: COLONOSCOPY WITH PROPOFOL; N/A     Comment:  Procedure: COLONOSCOPY WITH PROPOFOL;  Surgeon: Scot Jun, MD;  Location: [facility] ENDOSCOPY;  Service:               Endoscopy;  Laterality: N/A; No date: KNEE SURGERY; Left     Comment:  cart. removed 05/30/2012: LUMBAR LAMINECTOMY/DECOMPRESSION  MICRODISCECTOMY; Bilateral     Comment:  Procedure: LUMBAR LAMINECTOMY/DECOMPRESSION               MICRODISCECTOMY 3 LEVELS;  Surgeon: Barnett Abu, MD;                Location: [facility] NEURO ORS;  Service: Neurosurgery;                Laterality: Bilateral;  Bilateral Lumbar               two-three,three-four,four-five Laminectomy/Foraminotomy No date: MANDIBLE SURGERY No date: OOPHORECTOMY No date: TRIGGER FINGER RELEASE; Left     Comment:  thumb   Reproductive/Obstetrics negative OB ROS                             Anesthesia Physical Anesthesia Plan  ASA: II  Anesthesia Plan: Spinal   Post-op Pain Management:    Induction:   PONV Risk Score and Plan: 4 or greater  Airway Management Planned:   Additional Equipment:   Intra-op Plan:   Post-operative Plan:   Informed Consent: I have reviewed the patients History and Physical, chart, labs and discussed the procedure including the risks, benefits and alternatives for the proposed anesthesia with the patient or authorized representative who has indicated his/her understanding and acceptance.     Dental Advisory Given  Plan Discussed with: CRNA  Anesthesia Plan Comments:         Anesthesia Quick Evaluation

## 2020-03-16 NOTE — Op Note (Signed)
OPERATIVE NOTE  DATE OF SURGERY:  03/16/2020  PATIENT NAME:  Tina Petty   DOB: 11-21-1950  MRN: [id number]  PRE-OPERATIVE DIAGNOSIS: Degenerative arthrosis of the left knee, primary  POST-OPERATIVE DIAGNOSIS:  Same  PROCEDURE:  Left total knee arthroplasty using computer-assisted navigation  SURGEON:  Jena Gauss. M.D.  ASSISTANT: Baldwin [place], PA-C (present and scrubbed throughout the case, critical for assistance with exposure, retraction, instrumentation, and closure)  ANESTHESIA: spinal  ESTIMATED BLOOD LOSS: 50 mL  FLUIDS REPLACED: 1500 mL of crystalloid  TOURNIQUET TIME: 105 minutes  DRAINS: 2 medium Hemovac drains  SOFT TISSUE RELEASES: Anterior cruciate ligament, posterior cruciate ligament, deep medial collateral ligament, patellofemoral ligament  IMPLANTS UTILIZED: DePuy Attune size 6N posterior stabilized femoral component (cemented), size 5 rotating platform tibial component (cemented), 35 mm medialized dome patella (cemented), and a 5 mm stabilized rotating platform polyethylene insert.  INDICATIONS FOR SURGERY: Tina Petty is a 69 y.o. year old female with a long history of progressive knee pain. X-rays demonstrated severe degenerative changes in tricompartmental fashion. The patient had not seen any significant improvement despite conservative nonsurgical intervention. After discussion of the risks and benefits of surgical intervention, the patient expressed understanding of the risks benefits and agree with plans for total knee arthroplasty.   The risks, benefits, and alternatives were discussed at length including but not limited to the risks of infection, bleeding, nerve injury, stiffness, blood clots, the need for revision surgery, cardiopulmonary complications, among others, and they were willing to proceed.  PROCEDURE IN DETAIL: The patient was brought into the operating room and, after adequate spinal anesthesia was achieved, a tourniquet was  placed on the patient's upper thigh. The patient's knee and leg were cleaned and prepped with alcohol and DuraPrep and draped in the usual sterile fashion. A "timeout" was performed as per usual protocol. The lower extremity was exsanguinated using an Esmarch, and the tourniquet was inflated to 300 mmHg. An anterior longitudinal incision was made followed by a standard mid vastus approach. The deep fibers of the medial collateral ligament were elevated in a subperiosteal fashion off of the medial flare of the tibia so as to maintain a continuous soft tissue sleeve. The patella was subluxed laterally and the patellofemoral ligament was incised. Inspection of the knee demonstrated severe degenerative changes with full-thickness loss of articular cartilage. Osteophytes were debrided using a rongeur. Anterior and posterior cruciate ligaments were excised. Two 4.0 mm Schanz pins were inserted in the femur and into the tibia for attachment of the array of trackers used for computer-assisted navigation. Hip center was identified using a circumduction technique. Distal landmarks were mapped using the computer. The distal femur and proximal tibia were mapped using the computer. The distal femoral cutting guide was positioned using computer-assisted navigation so as to achieve a 5 distal valgus cut. The femur was sized and it was felt that a size 6N femoral component was appropriate. A size 6 femoral cutting guide was positioned and the anterior cut was performed and verified using the computer. This was followed by completion of the posterior and chamfer cuts. Femoral cutting guide for the central box was then positioned in the center box cut was performed.  Attention was then directed to the proximal tibia. Medial and lateral menisci were excised. The extramedullary tibial cutting guide was positioned using computer-assisted navigation so as to achieve a 0 varus-valgus alignment and 3 posterior slope. The cut was  performed and verified using the computer. The proximal tibia was  sized and it was felt that a size 5 tibial tray was appropriate.  A large contained cystic lesion was noted along the posterolateral tibial plateau.  The cyst was debrided using curettes and suctioned dry.  Tibial and femoral trials were inserted followed by insertion of a 5 mm polyethylene insert. This allowed for excellent mediolateral soft tissue balancing both in flexion and in full extension. Finally, the patella was cut and prepared so as to accommodate a 35 mm medialized dome patella. A patella trial was placed and the knee was placed through a range of motion with excellent patellar tracking appreciated. The femoral trial was removed after debridement of posterior osteophytes. The central post-hole for the tibial component was reamed followed by insertion of a keel punch. Tibial trials were then removed. Cut surfaces of bone were irrigated with copious amounts of normal saline using pulsatile lavage and then suctioned dry. Polymethylmethacrylate cement was prepared in the usual fashion using a vacuum mixer. Cement was applied to the cut surface of the proximal tibia as well as along the undersurface of a size 5 rotating platform tibial component. Tibial component was positioned and impacted into place. Excess cement was removed using Personal assistant. Cement was then applied to the cut surfaces of the femur as well as along the posterior flanges of the size 6N femoral component. The femoral component was positioned and impacted into place. Excess cement was removed using Personal assistant. A 5 mm polyethylene trial was inserted and the knee was brought into full extension with steady axial compression applied. Finally, cement was applied to the backside of a 35 mm medialized dome patella and the patellar component was positioned and patellar clamp applied. Excess cement was removed using Personal assistant. After adequate curing of the cement, the  tourniquet was deflated after a total tourniquet time of 105 minutes. Hemostasis was achieved using electrocautery. The knee was irrigated with copious amounts of normal saline using pulsatile lavage followed by 500 ml of Surgiphor and then suctioned dry. 20 mL of 1.3% Exparel and 60 mL of 0.25% Marcaine in 40 mL of normal saline was injected along the posterior capsule, medial and lateral gutters, and along the arthrotomy site. A 5 mm stabilized rotating platform polyethylene insert was inserted and the knee was placed through a range of motion with excellent mediolateral soft tissue balancing appreciated and excellent patellar tracking noted. 2 medium drains were placed in the wound bed and brought out through separate stab incisions. The medial parapatellar portion of the incision was reapproximated using interrupted sutures of #1 Vicryl. Subcutaneous tissue was approximated in layers using first #0 Vicryl followed #2-0 Vicryl. The skin was approximated with skin staples. A sterile dressing was applied.  The patient tolerated the procedure well and was transported to the recovery room in stable condition.    Arrow Tomko P. Angie Fava., M.D.

## 2020-03-16 NOTE — Progress Notes (Signed)
Pt arrived to room from PACU. Pt A&O X4. No complaints of pain. Surgical dressing clean dry and intact. Hemovac in place. Bone foam applied. TED hose and foot pumps on. Daughter in law @ bedside. Bed in lowest position, call bell in reach and bed alarm on.

## 2020-03-17 ENCOUNTER — Encounter: Payer: Self-pay | Admitting: Orthopedic Surgery

## 2020-03-17 MED ORDER — OXYCODONE HCL 5 MG PO TABS
5.0000 mg | ORAL_TABLET | ORAL | 0 refills | Status: DC | PRN
Start: 2020-03-17 — End: 2020-03-17

## 2020-03-17 MED ORDER — TRAMADOL HCL 50 MG PO TABS: 50.0000 mg | ORAL_TABLET | ORAL | 0 refills | Status: DC | PRN

## 2020-03-17 MED ORDER — OXYCODONE HCL 5 MG PO TABS
5.0000 mg | ORAL_TABLET | ORAL | 0 refills | Status: DC | PRN
Start: 2020-03-17 — End: 2020-07-04

## 2020-03-17 MED ORDER — ENOXAPARIN SODIUM 40 MG/0.4ML ~~LOC~~ SOLN: 40.0000 mg | SUBCUTANEOUS | 0 refills | Status: DC

## 2020-03-17 MED ORDER — CELECOXIB 200 MG PO CAPS: 200.0000 mg | ORAL_CAPSULE | Freq: Two times a day (BID) | ORAL | 0 refills | Status: DC

## 2020-03-17 NOTE — TOC Initial Note (Signed)
Transition of Care [facility]) - Initial/Assessment Note    Patient Details  Name: Tina Petty MRN: [id number] Date of Birth: 11/24/1950  Transition of Care [facility]) CM/SW Contact:    Shelbie Ammons, RN Phone Number: 03/17/2020, 2:00 PM  Clinical Narrative:    RNCM met with patient in room. Patient sitting up in bed reports to feeling some better this afternoon, did have some issues earlier with N/V but thinks that may have been due to pain medication she had taken. Patient is one day status post left knee replacement. She reports that she lives alone at home but has good support from her son and daughter in law. Patient reports that she has already been contacted by [facility] for home health and they are supposed to see her over the weekend. She reports she has a rolling walker at home but that she is agreeable to a 3N1.  RNCM reached out to [place] with [facility] to verify home health.  RNCM reached out to [employer] re 3N1.             Expected Discharge Plan: [place] Barriers to Discharge: No Barriers Identified   Patient Goals and CMS Choice     Choice offered to / list presented to : Patient  Expected Discharge Plan and Services Expected Discharge Plan: [place] Acute Care Choice: [place] arrangements for the past 2 months: [place]                 DME Arranged: 3-N-1 DME Agency: [employer] Date DME Agency Contacted: 03/17/20 Time DME Agency Contacted: [id number]   [place]: PT,OT [place]: [facility] (formerly [employer]) Date [place]: 03/17/20 Time [place]: 21 Representative spoke with at [place]: [place] Arrangements/Services Living arrangements for the past 2 months: [place] Lives with:: Self Patient language and need for interpreter reviewed:: Yes Do you feel safe going back to the place where you live?: Yes      Need for Family  Participation in Patient Care: Yes (Comment) Care giver support system in place?: Yes (comment)   Criminal Activity/Legal Involvement Pertinent to Current Situation/Hospitalization: No - Comment as needed  Activities of Daily Living Home Assistive Devices/Equipment: Eyeglasses,Cane (specify quad or straight),Walker (specify type) ADL Screening (condition at time of admission) Patient's cognitive ability adequate to safely complete daily activities?: Yes Is the patient deaf or have difficulty hearing?: No Does the patient have difficulty seeing, even when wearing glasses/contacts?: No Does the patient have difficulty concentrating, remembering, or making decisions?: No Patient able to express need for assistance with ADLs?: Yes Does the patient have difficulty dressing or bathing?: No Independently performs ADLs?: Yes (appropriate for developmental age) Does the patient have difficulty walking or climbing stairs?: Yes Weakness of Legs: Left Weakness of Arms/Hands: None  Permission Sought/Granted                  Emotional Assessment Appearance:: Appears stated age Attitude/Demeanor/Rapport: Engaged Affect (typically observed): Appropriate,Calm Orientation: : Oriented to Self,Oriented to Place,Oriented to  Time,Oriented to Situation Alcohol / Substance Use: Not Applicable Psych Involvement: No (comment)  Admission diagnosis:  Total knee replacement status [Z96.659] Patient Active Problem List   Diagnosis Date Noted  . 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 Anxiety 03/15/2020  . Depression 03/15/2020  . Hyperlipidemia 03/15/2020  . Hypertension 03/15/2020  . Primary osteoarthritis of  left knee 06/26/2016  . 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: [phone]:  [phone]    Social Determinants of Health (SDOH) Interventions    Readmission Risk Interventions No flowsheet data found.

## 2020-03-17 NOTE — Progress Notes (Signed)
ORTHOPAEDICS PROGRESS NOTE  PATIENT NAME: Tina Petty DOB: Aug 21, 1950  MRN: [id number]  POD # 1: Left total knee arthroplasty  Subjective: The patient rested well last night.  She denies any nausea or vomiting.  Pain is been under good control. Nursing did not get the patient up to bedside.  Objective: Vital signs in last 24 hours: Temp:  [96.3 F (35.7 C)-98.2 F (36.8 C)] 97.9 F (36.6 C) (12/30 0510) Pulse Rate:  [56-73] 56 (12/30 0510) Resp:  [9-18] 17 (12/29 2337) BP: (125-163)/(57-76) 132/57 (12/30 0510) SpO2:  [89 %-99 %] 97 % (12/30 0510) Weight:  [94.2 kg-94.4 kg] 94.2 kg (12/29 1800)  Intake/Output from previous day: 12/29 0701 - 12/30 0700 In: 1833.1 [I.V.:1533.1; IV Piggyback:300] Out: 1050 [Urine:900; Drains:100; Blood:50]  No results for input(s): WBC, HGB, HCT, PLT, K, CL, CO2, BUN, CREATININE, GLUCOSE, CALCIUM, LABPT, INR in the last 72 hours.  EXAM General: Well-developed well-nourished female seen in no apparent discomfort Lungs: clear to auscultation Cardiac: normal rate and regular rhythm Abdomen: Soft, nontender, nondistended.  Bowel sounds are present. Left lower extremity: Dressing is dry and intact.  Polar Care is in place and functioning.  The patient is able to perform an independent straight leg raise.  Homans test is negative. Neurologic: Awake, alert, and oriented.  Sensory and motor function are intact.  Assessment: Left total knee arthroplasty  Secondary diagnoses: Hypertension Hyperlipidemia Depression  Plan: Today's goals were reviewed with the patient.  Begin physical therapy and Occupational Therapy as per total knee arthroplasty rehab protocol. Plan is to go Home after hospital stay. DVT Prophylaxis - Lovenox, Foot Pumps and TED hose  Tina Merced P. Tina Petty M.D.

## 2020-03-17 NOTE — Progress Notes (Signed)
Patient c/o feeling dizzy bp elevated at 167/57. Dr. Ernest Pine is aware, given PRN zofran , May be intolerant of oxycodone. She is alert and encouraging PO fluids

## 2020-03-17 NOTE — Plan of Care (Signed)

## 2020-03-17 NOTE — Progress Notes (Signed)
Physical Therapy Treatment Patient Details Name: Tina Petty MRN: [id number] DOB: 1951/02/29 Today's Date: 03/17/2020    History of Present Illness Pt is a 69 yo female diagnosed with degenerative arthrosis of the left knee and is s/p elective L TKA.  PMH includes HTN, anxiety, depression, and dizziness.    PT Comments    Pt was pleasant and motivated to participate during the session and did not present with any dizziness or nausea this session.  Pt performed all functional tasks without physical assistance and demonstrated good carryover of proper sequencing with transfers and gait.  Pt was steady with ambulation with a gait pattern that quickly progressed from step-to to step-through and reported no adverse symptoms other than mild L knee pain during the session.  Pt will benefit from HHPT services upon discharge to safely address deficits listed in patient problem list for decreased caregiver assistance and eventual return to PLOF.     Follow Up Recommendations  Home health PT;Supervision for mobility/OOB     Equipment Recommendations  3in1 (PT)    Recommendations for Other Services       Precautions / Restrictions Precautions Precautions: Fall;Knee Restrictions Weight Bearing Restrictions: Yes LLE Weight Bearing: Weight bearing as tolerated    Mobility  Bed Mobility Overal bed mobility: Modified Independent             General bed mobility comments: Extra time and effort  Transfers Overall transfer level: Needs assistance Equipment used: Rolling walker (2 wheeled) Transfers: Sit to/from Stand Sit to Stand: Min guard         General transfer comment: Min to mod verbal cues for hand and LLE placement with good concentric and eccentric control  Ambulation/Gait Ambulation/Gait assistance: Min guard Gait Distance (Feet): 150 Feet Assistive device: Rolling walker (2 wheeled) Gait Pattern/deviations: Step-through pattern;Step-to pattern;Decreased stance  time - left;Antalgic;Decreased step length - right;Decreased step length - left Gait velocity: decreased   General Gait Details: Mildly antalgic gait pattern that progressed quickly from step-to pattern to step-through pattern with good stability throughout   Stairs             Wheelchair Mobility    Modified Rankin (Stroke Patients Only)       Balance Overall balance assessment: Needs assistance Sitting-balance support: No upper extremity supported;Feet supported Sitting balance-Leahy Scale: Normal     Standing balance support: Bilateral upper extremity supported;During functional activity Standing balance-Leahy Scale: Good Standing balance comment: Min lean on the RW for support                            Cognition Arousal/Alertness: Awake/alert Behavior During Therapy: WFL for tasks assessed/performed Overall Cognitive Status: Within Functional Limits for tasks assessed                                        Exercises Total Joint Exercises Ankle Circles/Pumps: AROM;Strengthening;Both;10 reps Quad Sets: AROM;Strengthening;Both;10 reps Gluteal Sets: Strengthening;Both;10 reps Straight Leg Raises: AROM;Strengthening;Left;10 reps Long Arc Quad: AROM;Strengthening;Left;10 reps;15 reps Knee Flexion: AROM;Strengthening;Left;10 reps;15 reps Goniometric ROM: L knee AROM: 6-71 deg Marching in Standing: AROM;Both;5 reps;Standing Other Exercises Other Exercises: HEP education/review per handout Other Exercises: Positioning education for L knee ext PROM Other Exercises: Car transfer sequencing verbal education with pt and dghtr in law Other Exercises: 90 deg L turn training to prevent L knee CKC twisting Other  Exercises: Pt and dtr in law education on polar care use    General Comments        Pertinent Vitals/Pain Pain Assessment: 0-10 Pain Score: 5  Pain Location: L knee during mobility Pain Descriptors / Indicators: Aching;Sore Pain  Intervention(s): Premedicated before session;Monitored during session    Home Living Family/patient expects to be discharged to:: Private residence Living Arrangements: Alone Available Help at Discharge: Family;Friend(s);Available 24 hours/day Type of Home: House Home Access: Level entry   Home Layout: One level Home Equipment: Walker - 2 wheels;Cane - single point Additional Comments: son and DIL live right behind pt, friends also available to assist    Prior Function Level of Independence: Independent          PT Goals (current goals can now be found in the care plan section) Acute Rehab PT Goals Patient Stated Goal: To go home PT Goal Formulation: With patient Time For Goal Achievement: 03/30/20 Potential to Achieve Goals: Good Progress towards PT goals: Progressing toward goals    Frequency    BID      PT Plan Current plan remains appropriate    Co-evaluation              AM-PAC PT "6 Clicks" Mobility   Outcome Measure  Help needed turning from your back to your side while in a flat bed without using bedrails?: A Little Help needed moving from lying on your back to sitting on the side of a flat bed without using bedrails?: A Little Help needed moving to and from a bed to a chair (including a wheelchair)?: A Little Help needed standing up from a chair using your arms (e.g., wheelchair or bedside chair)?: A Little Help needed to walk in hospital room?: A Little Help needed climbing 3-5 steps with a railing? : A Little 6 Click Score: 18    End of Session Equipment Utilized During Treatment: Gait belt Activity Tolerance: Patient tolerated treatment well Patient left: in chair;with call bell/phone within reach;with chair alarm set;with SCD's reapplied;Other (comment) (polar care donned to L knee) Nurse Communication: Mobility status PT Visit Diagnosis: Muscle weakness (generalized) (M62.81);Pain;Other abnormalities of gait and mobility (R26.89) Pain -  Right/Left: Left Pain - part of body: Knee     Time: 6712-4580 PT Time Calculation (min) (ACUTE ONLY): 38 min  Charges:  $Gait Training: 8-22 mins $Therapeutic Exercise: 8-22 mins $Therapeutic Activity: 8-22 mins                     D. Scott Karita Dralle PT, DPT 03/17/20, 5:30 PM

## 2020-03-17 NOTE — Evaluation (Signed)
Physical Therapy Evaluation Patient Details Name: Tina Petty MRN: [id number] DOB: 1950-06-01 Today's Date: 03/17/2020   History of Present Illness  Pt is a 69 yo female diagnosed with degenerative arthrosis of the left knee and is s/p elective L TKA.  PMH includes HTN, anxiety, depression, and dizziness.    Clinical Impression  Pt was pleasant and motivated to participate during the session but was limited nausea, retching, and dizziness in sitting. Pt's BP taken in sitting at 189/73, nursing in room and aware.  Transfer/gait training deferred and will attempt during the PM session as tolerated by patient.  Pt did put forth good effort with below therex and did not require physical assistance with bed mobility tasks and is expected to make good progress towards goals once her adverse symptoms in sitting resolve.  Pt will benefit from HHPT services upon discharge to safely address deficits listed in patient problem list for decreased caregiver assistance and eventual return to PLOF.        Follow Up Recommendations Home health PT;Supervision for mobility/OOB    Equipment Recommendations  3in1 (PT)    Recommendations for Other Services       Precautions / Restrictions Precautions Precautions: Fall;Knee Restrictions Weight Bearing Restrictions: Yes LLE Weight Bearing: Weight bearing as tolerated Other Position/Activity Restrictions: Pt able to perform Ind LLE SLR without extensor lag, no KI required      Mobility  Bed Mobility Overal bed mobility: Modified Independent             General bed mobility comments: Extra time and effort only    Transfers                 General transfer comment: Deferred secondary to dizziness, nausea, and retching in sitting, BP taken at 189/73, nursing aware  Ambulation/Gait                Stairs            Wheelchair Mobility    Modified Rankin (Stroke Patients Only)       Balance Overall balance  assessment: Needs assistance           Standing balance-Leahy Scale: Normal                               Pertinent Vitals/Pain Pain Assessment: 0-10 Pain Score: 5  Pain Location: L knee Pain Descriptors / Indicators: Aching;Sore Pain Intervention(s): Premedicated before session;Monitored during session    Home Living Family/patient expects to be discharged to:: Private residence Living Arrangements: Alone Available Help at Discharge: Family;Friend(s);Available 24 hours/day Type of Home: House Home Access: Level entry     Home Layout: One level Home Equipment: Walker - 2 wheels;Cane - single point Additional Comments: son and DIL live right behind pt, friends also available to assist    Prior Function Level of Independence: Independent         Comments: Ind amb without an AD community distances, occasional SPC use, no fall history, Ind with ADLs     Hand Dominance        Extremity/Trunk Assessment   Upper Extremity Assessment Upper Extremity Assessment: Overall WFL for tasks assessed    Lower Extremity Assessment Lower Extremity Assessment: Generalized weakness;LLE deficits/detail LLE Deficits / Details: L hip flex strength >/= 3/5 LLE: Unable to fully assess due to pain       Communication   Communication: No difficulties  Cognition Arousal/Alertness: Awake/alert  Behavior During Therapy: WFL for tasks assessed/performed Overall Cognitive Status: Within Functional Limits for tasks assessed                                        General Comments      Exercises Total Joint Exercises Ankle Circles/Pumps: AROM;Strengthening;Both;10 reps;15 reps Quad Sets: AROM;Strengthening;Both;10 reps;15 reps Gluteal Sets: Strengthening;Both;10 reps;15 reps Straight Leg Raises: AROM;Strengthening;Left;10 reps Long Arc Quad: AROM;Strengthening;Left;10 reps;15 reps Knee Flexion: AROM;Strengthening;Left;10 reps;15 reps Goniometric ROM: L  knee AROM: 6-71 deg Other Exercises Other Exercises: HEP education per handout Other Exercises: Positioning education for L knee ext PROM   Assessment/Plan    PT Assessment Patient needs continued PT services  PT Problem List Decreased strength;Decreased range of motion;Decreased activity tolerance;Decreased balance;Decreased mobility;Decreased knowledge of use of DME;Pain       PT Treatment Interventions DME instruction;Gait training;Stair training;Functional mobility training;Therapeutic activities;Therapeutic exercise;Balance training;Patient/family education    PT Goals (Current goals can be found in the Care Plan section)  Acute Rehab PT Goals Patient Stated Goal: "To be able to walk without pain" PT Goal Formulation: With patient Time For Goal Achievement: 03/30/20 Potential to Achieve Goals: Good    Frequency BID   Barriers to discharge        Co-evaluation               AM-PAC PT "6 Clicks" Mobility  Outcome Measure Help needed turning from your back to your side while in a flat bed without using bedrails?: A Little Help needed moving from lying on your back to sitting on the side of a flat bed without using bedrails?: A Little Help needed moving to and from a bed to a chair (including a wheelchair)?: A Little Help needed standing up from a chair using your arms (e.g., wheelchair or bedside chair)?: A Little Help needed to walk in hospital room?: A Little Help needed climbing 3-5 steps with a railing? : A Lot 6 Click Score: 17    End of Session   Activity Tolerance: Other (comment) (Pt limited by nausea, retching, and dizziness in sitting) Patient left: in bed;with call bell/phone within reach;with bed alarm set;with SCD's reapplied;Other (comment) (polar care donned to L knee) Nurse Communication: Mobility status;Other (comment) (Pt BP in sitting, dizziness, nausea) PT Visit Diagnosis: Muscle weakness (generalized) (M62.81);Pain;Other abnormalities of gait  and mobility (R26.89) Pain - Right/Left: Left Pain - part of body: Knee    Time: 5797-2820 PT Time Calculation (min) (ACUTE ONLY): 36 min   Charges:   PT Evaluation $PT Eval Moderate Complexity: 1 Mod PT Treatments $Therapeutic Exercise: 8-22 mins        D. Scott Micah Galeno PT, DPT 03/17/20, 1:35 PM

## 2020-03-17 NOTE — Evaluation (Signed)
Occupational Therapy Evaluation Patient Details Name: Tina Petty MRN: [id number] DOB: 1950/11/08 Today's Date: 03/17/2020    History of Present Illness Pt is a 69 yo female diagnosed with degenerative arthrosis of the left knee and is s/p elective L TKA.  PMH includes HTN, anxiety, depression, and dizziness.   Clinical Impression   Pt seen for OT evaluation this date. Pt was living alone and independent in all ADLs prior to surgery, however used compensatory strategies for LB dressing in s/o knee pain. Pt is eager to return to PLOF with less pain and improved safety and independence. Pt currently requires supervision for standing grooming tasks, min guard for toilet transfer and functional mobility with RW, and min assist for toilet hygiene/clothing management. Pt instructed and provided handout in polar care mgt, falls prevention strategies, home/routines modifications, DME/AE for LB bathing and dressing tasks, and compression stocking mgt. Pt would benefit from skilled OT services including additional instruction in dressing techniques with assistive devices for dressing and bathing skills to support recall and carryover prior to discharge and ultimately to maximize safety, independence, and minimize falls risk and caregiver burden. Recommended discharge: home with  home health OT and intermittent supervision/assistance from friends/family members.     Follow Up Recommendations  Home health OT;Supervision - Intermittent    Equipment Recommendations  3 in 1 bedside commode       Precautions / Restrictions Precautions Precautions: Fall;Knee Restrictions Weight Bearing Restrictions: Yes LLE Weight Bearing: Weight bearing as tolerated      Mobility Bed Mobility Overal bed mobility: Modified Independent             General bed mobility comments: Extra time and effort    Transfers Overall transfer level: Needs assistance Equipment used: Rolling walker (2  wheeled) Transfers: Sit to/from Stand Sit to Stand: Supervision            Balance Overall balance assessment: Needs assistance Sitting-balance support: No upper extremity supported;Feet supported Sitting balance-Leahy Scale: Normal       Standing balance-Leahy Scale: Good Standing balance comment: during functional mobility with RW                           ADL either performed or assessed with clinical judgement   ADL Overall ADL's : Needs assistance/impaired     Grooming: Wash/dry hands;Supervision/safety;Standing                   Toilet Transfer: Min guard;Cueing for safety;Ambulation;BSC;Grab bars;RW Statistician Details (indicate cue type and reason): Required cues for hand placement for controlled descent Toileting- Clothing Manipulation and Hygiene: Minimal assistance       Functional mobility during ADLs: Min guard;Rolling walker General ADL Comments: to walk short household distances                  Pertinent Vitals/Pain Pain Assessment: 0-10 Pain Score: 5  Pain Location: L knee during mobility Pain Descriptors / Indicators: Aching;Sore Pain Intervention(s): Premedicated before session;Monitored during session        Extremity/Trunk Assessment Upper Extremity Assessment Upper Extremity Assessment: Overall WFL for tasks assessed   Lower Extremity Assessment Lower Extremity Assessment: Defer to PT evaluation    Cervical / Trunk Assessment Cervical / Trunk Assessment: Normal   Communication Communication Communication: No difficulties   Cognition Arousal/Alertness: Awake/alert Behavior During Therapy: WFL for tasks assessed/performed Overall Cognitive Status: Within Functional Limits for tasks assessed  Home Living Family/patient expects to be discharged to:: Private residence Living Arrangements: Alone Available Help at Discharge:  Family;Friend(s);Available 24 hours/day Type of Home: House Home Access: Level entry     Home Layout: One level     Bathroom Shower/Tub: Chief Strategy Officer: Standard     Home Equipment: Environmental consultant - 2 wheels;Cane - single point   Additional Comments: son and DIL live right behind pt, friends also available to assist      Prior Functioning/Environment Level of Independence: Independent                OT Problem List: Decreased range of motion;Decreased activity tolerance;Impaired balance (sitting and/or standing);Decreased safety awareness;Decreased knowledge of use of DME or AE;Decreased knowledge of precautions;Pain      OT Treatment/Interventions: Self-care/ADL training;Therapeutic exercise;Energy conservation;DME and/or AE instruction;Therapeutic activities;Balance training;Patient/family education    OT Goals(Current goals can be found in the care plan section) Acute Rehab OT Goals Patient Stated Goal: To go home OT Goal Formulation: With patient Time For Goal Achievement: 03/31/20 Potential to Achieve Goals: Good ADL Goals Pt Will Perform Lower Body Bathing: with supervision;sitting/lateral leans Pt Will Perform Toileting - Clothing Manipulation and hygiene: with modified independence;sit to/from stand Pt Will Perform Tub/Shower Transfer: with modified independence;3 in 1;rolling walker  OT Frequency: Min 1X/week    AM-PAC OT "6 Clicks" Daily Activity     Outcome Measure Help from another person eating meals?: None Help from another person taking care of personal grooming?: A Little Help from another person toileting, which includes using toliet, bedpan, or urinal?: A Little Help from another person bathing (including washing, rinsing, drying)?: A Lot Help from another person to put on and taking off regular upper body clothing?: None Help from another person to put on and taking off regular lower body clothing?: A Lot 6 Click Score: 18   End of  Session Equipment Utilized During Treatment: Rolling walker Nurse Communication: Mobility status  Activity Tolerance: Patient tolerated treatment well Patient left: in bed;with SCD's reapplied  OT Visit Diagnosis: Unsteadiness on feet (R26.81);Pain Pain - Right/Left: Right Pain - part of body: Knee                Time: 1351-1420 OT Time Calculation (min): 29 min Charges:  OT General Charges $OT Visit: 1 Visit OT Evaluation $OT Eval Moderate Complexity: 1 Mod OT Treatments $Self Care/Home Management : 8-22 mins $Therapeutic Activity: 8-22 mins  Duwayne Heck [phone]  03/17/2020, 2:51 PM

## 2020-03-17 NOTE — Anesthesia Postprocedure Evaluation (Signed)
Anesthesia Post Note  Patient: Tina Petty  Procedure(s) Performed: COMPUTER ASSISTED TOTAL KNEE ARTHROPLASTY (Left Knee)  Patient location during evaluation: Nursing Unit Anesthesia Type: Spinal Level of consciousness: oriented and awake and alert Pain management: pain level controlled Vital Signs Assessment: post-procedure vital signs reviewed and stable Respiratory status: spontaneous breathing and respiratory function stable Cardiovascular status: blood pressure returned to baseline and stable Postop Assessment: no headache, no backache, no apparent nausea or vomiting and patient able to bend at knees Anesthetic complications: no   No complications documented.   Last Vitals:  Vitals:   03/16/20 2337 03/17/20 0510  BP: 125/60 (!) 132/57  Pulse: 73 (!) 56  Resp: 17   Temp: 36.8 C 36.6 C  SpO2: 94% 97%    Last Pain:  Vitals:   03/17/20 0220  TempSrc:   PainSc: 3                  Jules Schick

## 2020-03-18 NOTE — Care Management Important Message (Signed)
Important Message  Patient Details  Name: Tina Petty MRN: [id number] Date of Birth: Aug 28, 1950   Medicare Important Message Given:  N/A - LOS <3 / Initial given by admissions     [name] 03/18/2020, 8:02 AM

## 2020-03-18 NOTE — Discharge Summary (Signed)
Physician Discharge Summary  Patient ID: Tina Petty MRN: [id number] DOB/AGE: 1950/11/14 68 y.o.  Admit date: 03/16/2020 Discharge date: 03/18/2020  Admission Diagnoses:  Total knee replacement status [Z96.659]  Surgeries:Procedure(s):  Left total knee arthroplasty using computer-assisted navigation  SURGEON:  Jena Gauss. M.D.  ASSISTANT: Baldwin Jamaica, PA-C (present and scrubbed throughout the case, critical for assistance with exposure, retraction, instrumentation, and closure)  ANESTHESIA: spinal  ESTIMATED BLOOD LOSS: 50 mL  FLUIDS REPLACED: 1500 mL of crystalloid  TOURNIQUET TIME: 105 minutes  DRAINS: 2 medium Hemovac drains  SOFT TISSUE RELEASES: Anterior cruciate ligament, posterior cruciate ligament, deep medial collateral ligament, patellofemoral ligament  IMPLANTS UTILIZED: DePuy Attune size 6N posterior stabilized femoral component (cemented), size 5 rotating platform tibial component (cemented), 35 mm medialized dome patella (cemented), and a 5 mm stabilized rotating platform polyethylene insert.  Discharge Diagnoses: Patient Active Problem List   Diagnosis Date Noted  . 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 Anxiety 03/15/2020  . Depression 03/15/2020  . Hyperlipidemia 03/15/2020  . Hypertension 03/15/2020  . Primary osteoarthritis of left knee 06/26/2016  . 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 Arthritis   . Depression   . Hx of dizziness   . Hyperlipidemia   . Hypertension   . PONV (postoperative nausea and vomiting)    not recently     Transfusion:    Consultants (if any):   Discharged Condition: Improved  Hospital Course: Tina Petty is an 69 y.o. female who was admitted 03/16/2020 with a diagnosis of left knee osteoarthritis and went to the operating room on 03/16/2020 and underwent left total knee arthroplasty. The  patient received perioperative antibiotics for prophylaxis (see below). The patient tolerated the procedure well and was transported to PACU in stable condition. After meeting PACU criteria, the patient was subsequently transferred to the Orthopaedics/Rehabilitation unit.   The patient received DVT prophylaxis in the form of early mobilization, Lovenox, Foot Pumps and TED hose. A sacral pad had been placed and heels were elevated off of the bed with rolled towels in order to protect skin integrity. Foley catheter was discontinued on postoperative day #0. Wound drains were discontinued on postoperative day #2. The surgical incision was healing well without signs of infection.  Physical therapy was initiated postoperatively for transfers, gait training, and strengthening. Occupational therapy was initiated for activities of daily living and evaluation for assisted devices. Rehabilitation goals were reviewed in detail with the patient. The patient made steady progress with physical therapy and physical therapy recommended discharge to Home.   The patient achieved the preliminary goals of this hospitalization and was felt to be medically and orthopaedically appropriate for discharge.  She was given perioperative antibiotics:  Anti-infectives (From admission, onward)   Start     Dose/Rate Route Frequency Ordered Stop   03/16/20 1830  ceFAZolin (ANCEF) IVPB 2g/100 mL premix        2 g 200 mL/hr over 30 Minutes Intravenous Every 6 hours 03/16/20 1726 03/17/20 0053   03/16/20 0940  ceFAZolin (ANCEF) 2-4 GM/100ML-% IVPB       Note to Pharmacy: Brain Hilts   : cabinet override      03/16/20 0940 03/17/20 0053   03/16/20 0600  ceFAZolin (ANCEF) IVPB 2g/100 mL premix        2 g 200 mL/hr over 30 Minutes Intravenous On call to O.R. 03/16/20 0225 03/16/20 1237    .  Recent vital signs:  Vitals:   03/18/20 0453 03/18/20 0812  BP: (!) 142/59 (!) 131/43  Pulse: 68 69  Resp: 16 17  Temp: 97.7 F (36.5  C) 98.2 F (36.8 C)  SpO2: 95% 97%    Recent laboratory studies:  No results for input(s): WBC, HGB, HCT, PLT, K, CL, CO2, BUN, CREATININE, GLUCOSE, CALCIUM, LABPT, INR in the last 72 hours.  Diagnostic Studies: DG Knee Left Port  Result Date: 03/16/2020 CLINICAL DATA:  Left knee arthroplasty EXAM: PORTABLE LEFT KNEE - 1-2 VIEW COMPARISON:  None. FINDINGS: Frontal and cross-table lateral views of the left knee demonstrate 3 component left knee arthroplasty in the expected position without signs of acute complication. Postsurgical changes are seen in the soft tissues. Surgical drain within the suprapatellar region. IMPRESSION: 1. Unremarkable left knee arthroplasty. Electronically Signed   By: Sharlet Salina M.D.   On: 03/16/2020 16:36    Discharge Medications:   Allergies as of 03/18/2020      Reactions   Pravastatin    Muscle pain      Medication List    STOP taking these medications   meloxicam 15 MG tablet Commonly known as: MOBIC     TAKE these medications   acetaminophen 500 MG tablet Commonly known as: TYLENOL Take 500 mg by mouth daily as needed for moderate pain.   amLODipine-benazepril 5-20 MG capsule Commonly known as: LOTREL Take 1 capsule by mouth daily.   Calcium Carb-Cholecalciferol 600-800 MG-UNIT Tabs Take 1 tablet by mouth 2 (two) times daily.   celecoxib 200 MG capsule Commonly known as: CELEBREX Take 1 capsule (200 mg total) by mouth 2 (two) times daily.   enoxaparin 40 MG/0.4ML injection Commonly known as: LOVENOX Inject 0.4 mLs (40 mg total) into the skin daily for 14 days.   fish oil-omega-3 fatty acids 1000 MG capsule Take 1 g by mouth daily.   FLUoxetine 20 MG capsule Commonly known as: PROZAC Take 20 mg by mouth daily.   multivitamin with minerals tablet Take 1 tablet by mouth 3 (three) times a week. Centrum Silver 50+   oxyCODONE 5 MG immediate release tablet Commonly known as: Oxy IR/ROXICODONE Take 1 tablet (5 mg total) by mouth  every 4 (four) hours as needed for severe pain.   rosuvastatin 5 MG tablet Commonly known as: CRESTOR Take 5 mg by mouth daily.   traMADol 50 MG tablet Commonly known as: ULTRAM Take 1 tablet (50 mg total) by mouth every 4 (four) hours as needed for severe pain.   Vitamin D 50 MCG (2000 UT) tablet Take 2,000 Units by mouth daily.            Durable Medical Equipment  (From admission, onward)         Start     Ordered   03/16/20 1722  DME Walker rolling  Once       Question:  Patient needs a walker to treat with the following condition  Answer:  Total knee replacement status   03/16/20 1725   03/16/20 1722  DME Bedside commode  Once       Question:  Patient needs a bedside commode to treat with the following condition  Answer:  Total knee replacement status   03/16/20 1725         Disposition: Plan for discharge home today, 03/18/20   Follow-up Information    Myrtis Ser On 03/31/2020.   Specialty: Orthopedic Surgery Why: at 10:45am Contact information: [address] [facility] [place] [address] [phone]        Donato Heinz, MD On 04/28/2020.   Specialty: Orthopedic Surgery Why: at 2:00pm Contact information: [address] [facility] [place] [address] [phone]               Valeria Batman, PA-C 03/18/2020, 9:11 AM

## 2020-03-18 NOTE — Progress Notes (Signed)
Patient discharged with personal belongings, discharge instructions, IV removed from RFA without incident. Ted hose in place. Daughter in law at bedside. Transferred out via w/c to personal vehicle

## 2020-03-18 NOTE — Progress Notes (Signed)
  Subjective: 2 Days Post-Op Procedure(s) (LRB): COMPUTER ASSISTED TOTAL KNEE ARTHROPLASTY (Left) Patient reports pain as mild.   Patient is well, and has had no acute complaints or problems Plan is to go Home after hospital stay. Negative for chest pain and shortness of breath Fever: no Gastrointestinal:Negative for nausea and vomiting  Objective: Vital signs in last 24 hours: Temp:  [97.6 F (36.4 C)-98.2 F (36.8 C)] 98.2 F (36.8 C) (12/31 0812) Pulse Rate:  [56-76] 69 (12/31 0812) Resp:  [16-17] 17 (12/31 0812) BP: (116-168)/(43-65) 131/43 (12/31 0812) SpO2:  [94 %-100 %] 97 % (12/31 0812)  Intake/Output from previous day:  Intake/Output Summary (Last 24 hours) at 03/18/2020 0908 Last data filed at 03/18/2020 0445 Gross per 24 hour  Intake 760 ml  Output 40 ml  Net 720 ml    Intake/Output this shift: No intake/output data recorded.  Labs: No results for input(s): HGB in the last 72 hours. No results for input(s): WBC, RBC, HCT, PLT in the last 72 hours. No results for input(s): NA, K, CL, CO2, BUN, CREATININE, GLUCOSE, CALCIUM in the last 72 hours. No results for input(s): LABPT, INR in the last 72 hours.   EXAM General - Patient is Alert, Appropriate and Oriented Extremity - ABD soft Sensation intact distally Dorsiflexion/Plantar flexion intact Incision: dressing C/D/I No cellulitis present Dressing/Incision - Bulky dressing intact to the left knee was removed in addition to the hemovac.  No drainage noted. Motor Function - intact, moving foot and toes well on exam.  Past Medical History:  Diagnosis Date  . Arthritis   . Depression   . Hx of dizziness   . Hyperlipidemia   . Hypertension   . PONV (postoperative nausea and vomiting)    not recently    Assessment/Plan: 2 Days Post-Op Procedure(s) (LRB): COMPUTER ASSISTED TOTAL KNEE ARTHROPLASTY (Left) Active Problems:   Total knee replacement status  Estimated body mass index is 33.52 kg/m as  calculated from the following:   Height as of this encounter: 5\' 6"  (1.676 m).   Weight as of this encounter: 94.2 kg.  Patient has cleared steps with PT. Patient has had a BM. Bulky dressing removed today, honeycomb intact.  Hemovac removed. Plan for discharge home today.  DVT Prophylaxis - Lovenox, Foot Pumps and TED hose Weight-Bearing as tolerated to left leg  J. , PA-C [facility] Orthopaedic Surgery 03/18/2020, 9:08 AM

## 2020-03-18 NOTE — Plan of Care (Signed)

## 2020-03-18 NOTE — Progress Notes (Signed)
Physical Therapy Treatment Patient Details Name: Tina Petty MRN: [id number] DOB: July 02, 1950 Today's Date: 03/18/2020    History of Present Illness Pt is a 69 yo female diagnosed with degenerative arthrosis of the left knee and is s/p elective L TKA.  PMH includes HTN, anxiety, depression, and dizziness.    PT Comments    Pt was pleasant and motivated to participate during the session.  Pt was able to amb 200+ feet with good stability and improved cadence.  Pt was steady with good control ascending and descending steps and reported no adverse symptoms during the session other than mild L knee pain.  Pt will benefit from HHPT services upon discharge to safely address deficits listed in patient problem list for decreased caregiver assistance and eventual return to PLOF.     Follow Up Recommendations  Home health PT;Supervision for mobility/OOB     Equipment Recommendations  3in1 (PT)    Recommendations for Other Services       Precautions / Restrictions Precautions Precautions: Fall;Knee Restrictions Weight Bearing Restrictions: Yes LLE Weight Bearing: Weight bearing as tolerated Other Position/Activity Restrictions: Pt able to perform Ind LLE SLR without extensor lag, no KI required    Mobility  Bed Mobility Overal bed mobility: Modified Independent             General bed mobility comments: Extra time and effort  Transfers Overall transfer level: Needs assistance Equipment used: Rolling walker (2 wheeled) Transfers: Sit to/from Stand Sit to Stand: Supervision         General transfer comment: Min verbal cues for hand and LLE placement with good concentric and eccentric control  Ambulation/Gait Ambulation/Gait assistance: Supervision Gait Distance (Feet): 200 Feet Assistive device: Rolling walker (2 wheeled) Gait Pattern/deviations: Step-through pattern;Decreased stance time - left;Antalgic;Decreased step length - right;Decreased step length - left Gait  velocity: decreased   General Gait Details: Good stability with improved cadence   Stairs Stairs: Yes Stairs assistance: Min guard Stair Management: Two rails;Forwards;Step to pattern Number of Stairs: 4 General stair comments: Pt and daughter-in-law present for training with pt demonstrating good eccentric and concentric control and stability during stair training   Wheelchair Mobility    Modified Rankin (Stroke Patients Only)       Balance Overall balance assessment: Needs assistance Sitting-balance support: No upper extremity supported;Feet supported Sitting balance-Leahy Scale: Normal     Standing balance support: Bilateral upper extremity supported;During functional activity Standing balance-Leahy Scale: Good Standing balance comment: Min lean on the RW for support                            Cognition Arousal/Alertness: Awake/alert Behavior During Therapy: WFL for tasks assessed/performed Overall Cognitive Status: Within Functional Limits for tasks assessed                                        Exercises Total Joint Exercises Ankle Circles/Pumps: AROM;Strengthening;Both;10 reps Quad Sets: Strengthening;Both;10 reps;AROM Gluteal Sets: Strengthening;Both;10 reps Straight Leg Raises: AROM;Strengthening;Left;5 reps Long Arc Quad: AROM;Strengthening;Left;10 reps;15 reps Knee Flexion: AROM;Strengthening;Left;10 reps;15 reps Goniometric ROM: L knee AROM: 5-80 Marching in Standing: AROM;Both;5 reps;Standing Other Exercises Other Exercises: HEP education/review per handout    General Comments        Pertinent Vitals/Pain Pain Assessment: 0-10 Pain Score: 3  Pain Location: L knee during mobility Pain Descriptors / Indicators: Aching;Sore Pain  Intervention(s): Premedicated before session;Monitored during session    Home Living                      Prior Function            PT Goals (current goals can now be found in  the care plan section) Progress towards PT goals: Progressing toward goals    Frequency    BID      PT Plan Current plan remains appropriate    Co-evaluation              AM-PAC PT "6 Clicks" Mobility   Outcome Measure  Help needed turning from your back to your side while in a flat bed without using bedrails?: A Little Help needed moving from lying on your back to sitting on the side of a flat bed without using bedrails?: A Little Help needed moving to and from a bed to a chair (including a wheelchair)?: A Little Help needed standing up from a chair using your arms (e.g., wheelchair or bedside chair)?: A Little Help needed to walk in hospital room?: A Little Help needed climbing 3-5 steps with a railing? : A Little 6 Click Score: 18    End of Session Equipment Utilized During Treatment: Gait belt Activity Tolerance: Patient tolerated treatment well Patient left: in chair;with call bell/phone within reach;with chair alarm set;with SCD's reapplied;Other (comment);with family/visitor present (polar care donned to L knee) Nurse Communication: Mobility status PT Visit Diagnosis: Muscle weakness (generalized) (M62.81);Pain;Other abnormalities of gait and mobility (R26.89) Pain - Right/Left: Left Pain - part of body: Knee     Time: 7341-9379 PT Time Calculation (min) (ACUTE ONLY): 38 min  Charges:  $Gait Training: 23-37 mins $Therapeutic Exercise: 8-22 mins                     D. Scott Tanner PT, DPT 03/18/20, 1:05 PM

## 2020-05-03 ENCOUNTER — Other Ambulatory Visit: Payer: Self-pay | Admitting: Nurse Practitioner

## 2020-05-04 ENCOUNTER — Other Ambulatory Visit: Payer: Self-pay | Admitting: Nurse Practitioner

## 2020-05-04 DIAGNOSIS — N6320 Unspecified lump in the left breast, unspecified quadrant: Secondary | ICD-10-CM

## 2020-05-12 ENCOUNTER — Other Ambulatory Visit: Payer: Medicare Other

## 2020-05-13 ENCOUNTER — Other Ambulatory Visit: Payer: Self-pay

## 2020-05-13 ENCOUNTER — Ambulatory Visit
Admission: RE | Admit: 2020-05-13 | Discharge: 2020-05-13 | Disposition: A | Discharge status: Home / Self Care | Payer: Medicare Other | Source: Ambulatory Visit | Attending: Nurse Practitioner | Admitting: Nurse Practitioner

## 2020-05-13 DIAGNOSIS — N6320 Unspecified lump in the left breast, unspecified quadrant: Secondary | ICD-10-CM

## 2020-05-13 DIAGNOSIS — N6321 Unspecified lump in the left breast, upper outer quadrant: Secondary | ICD-10-CM | POA: Diagnosis not present

## 2020-07-04 ENCOUNTER — Ambulatory Visit
Admission: EM | Admit: 2020-07-04 | Discharge: 2020-07-04 | Disposition: A | Discharge status: Home / Self Care | Payer: Medicare Other

## 2020-07-04 ENCOUNTER — Other Ambulatory Visit: Payer: Self-pay

## 2020-07-04 DIAGNOSIS — M7712 Lateral epicondylitis, left elbow: Secondary | ICD-10-CM

## 2020-07-04 NOTE — ED Triage Notes (Signed)
Pt c/o left arm pain since having blood drawn prior to surgery. She reports numbness and tingling that radiates down her arm to her hand. She states the area of the blood draw was very bruised, and she thinks she feels some knots near there.

## 2020-07-04 NOTE — ED Provider Notes (Signed)
[facility]    CSN: [id number] Arrival date & time: 07/04/20  1645      History   Chief Complaint Chief Complaint  Patient presents with  . Arm Pain    left    HPI Tina Petty is a 70 y.o. female.   HPI   70 year old female here for evaluation of left arm pain.  Patient reports that she has been having pain in her left arm near her elbow for the past approximately 2 months.  She states that intermittently she will have numbness in her left hand but this waxes and wanes.  She reports that the numbness started about 1-1/2 weeks ago.  Patient thought that the pain might be related to a blood drawl that she had prior to her knee replacement surgery in December 2021 because just prior to her surgery she had blood drawn near the area where the pain is.  Patient denies any known injury or weakness in her grip in the left hand.  Past Medical History:  Diagnosis Date  . Arthritis   . Depression   . Hx of dizziness   . Hyperlipidemia   . Hypertension   . PONV (postoperative nausea and vomiting)    not recently    Patient Active Problem List   Diagnosis Date Noted  . 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 Anxiety 03/15/2020  . Depression 03/15/2020  . Hyperlipidemia 03/15/2020  . Hypertension 03/15/2020  . Primary osteoarthritis of left knee 06/26/2016  . 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 ABDOMINAL HYSTERECTOMY    . adenomatours colon polyp    . BREAST BIOPSY Right 1990   neg- papiloma  . BREAST LUMPECTOMY Right    papilloma  . carpel tunnel surgery Left   . CESAREAN SECTION    . chronic back pain unspecified     severe multilevel spondylitic stenosis  . COLONOSCOPY WITH PROPOFOL N/A 06/05/2017   Procedure: COLONOSCOPY WITH PROPOFOL;  Surgeon: Scot Jun, MD;  Location: [facility] ENDOSCOPY;  Service: Endoscopy;  Laterality: N/A;  . KNEE  ARTHROPLASTY Left 03/16/2020   Procedure: COMPUTER ASSISTED TOTAL KNEE ARTHROPLASTY;  Surgeon: Donato Heinz, MD;  Location: [facility] ORS;  Service: Orthopedics;  Laterality: Left;  . KNEE SURGERY Left    cart. removed  . LUMBAR LAMINECTOMY/DECOMPRESSION MICRODISCECTOMY Bilateral 05/30/2012   Procedure: LUMBAR LAMINECTOMY/DECOMPRESSION MICRODISCECTOMY 3 LEVELS;  Surgeon: Barnett Abu, MD;  Location: [facility] NEURO ORS;  Service: Neurosurgery;  Laterality: Bilateral;  Bilateral Lumbar two-three,three-four,four-five Laminectomy/Foraminotomy  . MANDIBLE SURGERY    . OOPHORECTOMY    . TRIGGER FINGER RELEASE Left    thumb    OB History   No obstetric history on file.      Home Medications    Prior to Admission medications   Medication Sig Start Date End Date Taking? Authorizing Provider  acetaminophen (TYLENOL) 500 MG tablet Take 500 mg by mouth daily as needed for moderate pain.   Yes [provider]  amLODipine-benazepril (LOTREL) 5-20 MG per capsule Take 1 capsule by mouth daily.   Yes [provider]  aspirin 81 MG EC tablet Take 1 tablet by mouth daily.   Yes [provider]  Calcium Carb-Cholecalciferol 600-800 MG-UNIT TABS Take 1 tablet by mouth 2 (two) times daily.   Yes [provider]  celecoxib (CELEBREX) 200 MG capsule Take 1 capsule (200 mg total) by mouth 2 (two)  times daily. 03/17/20  Yes Madelyn Flavors, PA-C  Cholecalciferol (VITAMIN D) 2000 UNITS tablet Take 2,000 Units by mouth daily.   Yes [provider]  fish oil-omega-3 fatty acids 1000 MG capsule Take 1 g by mouth daily.   Yes [provider]  FLUoxetine (PROZAC) 20 MG capsule Take 20 mg by mouth daily.   Yes [provider]  Multiple Vitamins-Minerals (MULTIVITAMIN WITH MINERALS) tablet Take 1 tablet by mouth 3 (three) times a week. Centrum Silver 50+   Yes [provider]  rosuvastatin (CRESTOR) 5 MG tablet Take 5 mg by mouth daily. 01/08/20  Yes  [provider]  enoxaparin (LOVENOX) 40 MG/0.4ML injection Inject 0.4 mLs (40 mg total) into the skin daily for 14 days. 03/17/20 07/04/20  Madelyn Flavors, PA-C    Family History Family History  Problem Relation Age of Onset  . Breast cancer Cousin        1st cousin paternal side    Social History Social History   Tobacco Use  . Smoking status: Never Smoker  . Smokeless tobacco: Never Used  Vaping Use  . Vaping Use: Never used  Substance Use Topics  . Alcohol use: No  . Drug use: No     Allergies   Pravastatin   Review of Systems Review of Systems  Constitutional: Negative for activity change, appetite change and fever.  Musculoskeletal: Positive for arthralgias and myalgias. Negative for joint swelling.  Skin: Negative for color change and wound.  Neurological: Positive for numbness. Negative for weakness.  Hematological: Negative.      Physical Exam Triage Vital Signs ED Triage Vitals  Enc Vitals Group     BP 07/04/20 1718 (!) 139/56     Pulse Rate 07/04/20 1718 69     Resp 07/04/20 1718 18     Temp 07/04/20 1718 98.3 F (36.8 C)     Temp Source 07/04/20 1718 Oral     SpO2 07/04/20 1718 99 %     Weight 07/04/20 1715 204 lb (92.5 kg)     Height 07/04/20 1715 5\' 6"  (1.676 m)     Head Circumference --      Peak Flow --      Pain Score 07/04/20 1714 5     Pain Loc --      Pain Edu? --      Excl. in GC? --    No data found.  Updated Vital Signs BP (!) 139/56 (BP Location: Left Arm)   Pulse 69   Temp 98.3 F (36.8 C) (Oral)   Resp 18   Ht 5\' 6"  (1.676 m)   Wt 204 lb (92.5 kg)   SpO2 99%   BMI 32.93 kg/m   Visual Acuity Right Eye Distance:   Left Eye Distance:   Bilateral Distance:    Right Eye Near:   Left Eye Near:    Bilateral Near:     Physical Exam Vitals and nursing note reviewed.  Constitutional:      General: She is not in acute distress.    Appearance: Normal appearance. She is not ill-appearing.  HENT:     Head:  Normocephalic and atraumatic.  Musculoskeletal:        General: Tenderness present. No swelling, deformity or signs of injury. Normal range of motion.  Skin:    General: Skin is warm and dry.     Capillary Refill: Capillary refill takes less than 2 seconds.     Findings: No bruising or  erythema.  Neurological:     General: No focal deficit present.     Mental Status: She is alert and oriented to person, place, and time.  Psychiatric:        Mood and Affect: Mood normal.        Behavior: Behavior normal.        Thought Content: Thought content normal.        Judgment: Judgment normal.      UC Treatments / Results  Labs (all labs ordered are listed, but only abnormal results are displayed) Labs Reviewed - No data to display  EKG   Radiology No results found.  Procedures Procedures (including critical care time)  Medications Ordered in UC Medications - No data to display  Initial Impression / Assessment and Plan / UC Course  I have reviewed the triage vital signs and the nursing notes.  Pertinent labs & imaging results that were available during my care of the patient were reviewed by me and considered in my medical decision making (see chart for details).   Patient is a very pleasant 70 year old female here for evaluation of left elbow pain with intermittent numbness in her left hand.  The elbow pain she describes as a soreness or an ache and has been going on for approximately 2 months and the numbness is only been present for last 1.5 weeks.  There is no erythema, edema, ecchymosis, or induration to the left elbow.  Patient does have mild tenderness to palpation along the lateral epicondyle of the humerus.  Patient has no tenderness when palpating over the radial head.  No tenderness with pronation or supination of her left elbow or with flexion or extension of her left elbow.  Grip strength in left hand is 5/5.  Radial and ulnar pulses are 2+.  Patient's exam is consistent  with lateral epicondylitis.  We will treat patient with diclofenac gel topically 4 times daily and home PT.   Final Clinical Impressions(s) / UC Diagnoses   Final diagnoses:  Lateral epicondylitis of left elbow     Discharge Instructions     Apply Voltaren gel to the area of pain of your left elbow 4 times a day to help with inflammation and pain.  Follow the rehabilitation exercises in your discharge paperwork.  If your symptoms do not improve return for reevaluation.    ED Prescriptions    None     PDMP not reviewed this encounter.   Becky Augusta, NP 07/04/20 1800

## 2020-07-04 NOTE — Discharge Instructions (Signed)
Apply Voltaren gel to the area of pain of your left elbow 4 times a day to help with inflammation and pain.  Follow the rehabilitation exercises in your discharge paperwork.  If your symptoms do not improve return for reevaluation.

## 2021-05-25 ENCOUNTER — Telehealth: Payer: Self-pay

## 2021-05-25 NOTE — Telephone Encounter (Addendum)
CALLED PATIENT NO ANSWER LEFT VOICEMAIL FOR A CALL BACK ?Letter printed and held  ?

## 2021-06-11 ENCOUNTER — Encounter: Payer: Self-pay | Admitting: Orthopedic Surgery

## 2021-06-11 NOTE — H&P (Signed)
ORTHOPAEDIC HISTORY & PHYSICAL ?Tina Petty, Adelina Mings., MD - 06/06/2021 10:15 AM EDT ?Formatting of this note is different from the original. ?Images from the original note were not included. ?Chief Complaint: ?Chief Complaint  ?Patient presents with  ? Left hip degenerative arthrosis  ? ?Reason for Visit: ?The patient is a 71 y.o. female who presents today for reevaluation of her left hip. She has a 1 year history of left hip and groin pain. She denies any trauma or aggravating event. The pain is worse with weight bearing and rotation of the hip. The hip pain limits the patient's ability to ambulate long distances. She has appreciated decreased range of motion of the left hip with activities such as donning/doffing shoes and stockings. The patient has not appreciated any significant improvement despite Tylenol, NSAIDs, activity modification, aquatics exercise, and ambulatory aids. She is using a cane for ambulation. The patient states that the hip pain has progressed to the point that it is significantly interfering with her activities of daily living. ? ?Medications: ?Current Outpatient Medications  ?Medication Sig Dispense Refill  ? acetaminophen (TYLENOL) 650 MG ER tablet Take 1 tablet (650 mg total) by mouth once daily  ? amLODIPine-benazepril (LOTREL) 5-20 mg capsule Take 1 capsule by mouth once daily 90 capsule 1  ? amoxicillin (AMOXIL) 500 MG capsule Please take 4 tablets 1 hour prior to dental procedure 4 capsule 0  ? aspirin 81 MG EC tablet Take 1 tablet (81 mg total) by mouth once daily  ? calcium carbonate-vitamin D3 (CALTRATE 600+D) 600 mg(1,500mg ) -200 unit tablet Take 1 tablet by mouth 2 (two) times daily with meals  ? celecoxib (CELEBREX) 100 MG capsule Take 1 capsule (100 mg total) by mouth 2 (two) times daily 60 capsule 2  ? multivit-min/iron/folic/lutein (CENTRUM SILVER WOMEN ORAL) Take 1 caplet by mouth once daily  ? psyllium husk, with sugar, (METAMUCIL) 3.4 gram/12 gram oral powder Take 1.7  g by mouth once daily Mix with a full glass (240 mL) of fluid.  ? rosuvastatin (CRESTOR) 5 MG tablet Take 1 tablet (5 mg total) by mouth once daily 90 tablet 1  ? FLUoxetine (PROZAC) 20 MG capsule Take 1 capsule (20 mg total) by mouth once daily for 90 days 90 capsule 1  ? ?No current facility-administered medications for this visit.  ? ?Allergies: ?Allergies  ?Allergen Reactions  ? Pravastatin Muscle Pain  ?Leg cramps  ? ?Past Medical History: ?Past Medical History:  ?Diagnosis Date  ? Adenomatous colon polyp  ? Anxiety  ? 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 Depression  ? Hyperlipidemia  ? Hypertension  ? Obesity (BMI 35.0-39.9 without comorbidity), unspecified 10/03/2017  ? Osteopenia 2019  ? Primary osteoarthritis of left knee 06/26/2016  ? ?Past Surgical History: ?Past Surgical History:  ?Procedure Laterality Date  ? Open medial menisectomy Left 1976  ? Jaw Surgery 1993  ?[facility]  ? COLONOSCOPY 10/22/2003  ?Adenomatous Polyp, [facility] (Father/Sister)  ? LAMINECTOMY LUMBAR SPINE 05/2012  ? COLONOSCOPY 06/05/2017  ?PH Adenomatous Polyp, [facility] (Father/Sister): [facility] 07/2022  ? Left total knee arthroplasty using computer-assisted navigation 03/16/2020  ?Dr Ernest Pine  ? CESAREAN SECTION 1983, 1986  ?[facility]  ? COLONOSCOPY 07/10/1999, 05/13/1995, 12/30/1992  ?[facility] (Father/Sister)  ? COLONOSCOPY 04/04/2012, 02/12/2007  ?PH Adenomatous Polyp, [facility] (Father/Sister): [facility] 03/2017; Recall Ltr mailed 03/08/2017 (dh)  ? ENDOSCOPIC CARPAL TUNNEL RELEASE  ?w/ trigger finger release [facility]  ? HYSTERECTOMY  ?Total  ? ?Social History: ?  Social History  ? ?Socioeconomic History  ? Marital status: Widowed  ? Number of children: 2  ? Years of education: 89  ? Highest education level: High school graduate  ?Occupational History  ? 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 Smoking status: Never  ? Smokeless tobacco: Never  ?Vaping Use  ? Vaping Use: Never  used  ?Substance and Sexual Activity  ? Alcohol use: No  ?Alcohol/week: 0.0 standard drinks  ? Drug use: No  ? Sexual activity: Defer  ?Partners: Male  ?Social History Narrative  ?Husband left her after 30 yrs in 07-13-09. Ex-husband died suddenly on 01-22-2015.  ?Living Situation: Both her sons Tenny Craw & Kennedy Bucker) live nearby with their families. Has ~1 yr Valetta Close that she lives with.  ? ?Family History: ?Family History  ?Problem Relation Age of Onset  ? Myocardial Infarction (Heart attack) Mother  ? Colon cancer Father  ? Diabetes type II Father  ? Colon cancer Sister  ? Rectal cancer Sister  ? Myocardial Infarction (Heart attack) Paternal Aunt  ? Diabetes type II Paternal Grandfather  ? Myocardial Infarction (Heart attack) Paternal Grandfather  ? Breast cancer Cousin  ? ?Review of Systems: ?A comprehensive 14 point ROS was performed, reviewed, and the pertinent orthopaedic findings are documented in the HPI. ? ?Exam ?BP 132/64  Ht 165.1 cm (5\' 5" )  Wt 94.6 kg (208 lb 9.6 oz)  BMI 34.71 kg/m?  ? ?General:  ?Well-developed, well-nourished female seen in no acute distress. ?Antalgic gait without evidence of significant abductor lurch. ? ?HEENT:  ?Atraumatic, normocephalic. Pupils are equal and reactive to light. Extraocular motion is intact. Sclera are clear. Oropharynx is clear with moist mucosa. ? ?Neck:  ?Supple, nontender, and with good ROM. No thyromegaly, adenopathy, JVD, or carotid bruits. ? ?Lungs:  ?Clear to auscultation bilaterally. ? ?Cardiovascular:  ?Regular rate and rhythm. Normal S1, S2. No murmur . No appreciable gallops or rubs. Peripheral pulses are palpable. No lower extremity edema. Homan`s test is negative. ? ?Abdomen:  ?Soft, nontender, nondistended. Bowel sounds are present. ? ?Spine: ?Alignment: No gross scoliosis. ?Normal lumbar lordosis. ?Sacroiliac joints: Nontender to palpation ?Patrick`s test: Negative ?Flip test: Negative ?Tenderness: None ?Paraspinous spasm: None ?Range of motion: Good  range of motion with both flexion and extension ? ?Extremities: ?Good strength, stability, and range of motion of the upper extremities. ?Good range of motion of the knees and ankles. ? ?Left Hip: ?Pelvic tilt: Negative ?Limb lengths: Equal with the patient standing ?Soft tissue swelling: Negative ?Erythema: Negative ?Crepitance: Negative ?Tenderness: Greater trochanter is nontender to palpation. Moderate pain is elicited by axial compression or extremes of rotation. ?Atrophy: No atrophy. Good hip flexor and abductor strength. ?Range of Motion: EXT/FLEX: 0/0/110 ADD/ABD: 20/0/20 IR/ER: 0/0/10 ? ?Vascular: ?Peripheral pulses are palpable. Good capillary refill. No gross pretibial or ankle edema. Homans test is negative. ? ?Neurologic:  ?Awake, alert, and oriented.  ?Sensory function is intact to pinprick and light touch.  ?Motor strength is judged to be 5/5.  ?Motor coordination is grossly within normal limits.  ?No apparent clonus. No tremor.  ?Deep tendon reflexes are symmetric. ? ?X-rays: ?I ordered and interpreted AP pelvis, AP and lateral radiographs of the left hip that were obtained in the office today. There is significant narrowing of the cartilage space with bone-on-bone articulation. Subchondral sclerosis is noted. Osteophyte formation is present.  ? ?Impression: ?Degenerative arthrosis of the left hip ? ?Plan:  ?The findings were discussed in detail with the patient. ?  The patient was given informational material on total hip replacement. ?Conservative treatment options were reviewed with the patient. We discussed the risks and benefits of surgical intervention. The usual perioperative course was also discussed in detail. The patient expressed understanding of the risks and benefits of surgical intervention and would like to proceed with plans for left total hip arthroplasty. ? ?I spent a total of 45 minutes in both face-to-face and non-face-to-face activities, excluding procedures performed, for this  visit on the date of this encounter. ? ?MEDICAL CLEARANCE: ?Per anesthesiology ?ACTIVITIES:  ?As tolerated. ?WORK STATUS: ?Anticipate out of work for 6-8 weeks following surgery. ?THERAPY: ?Preoperative physical t

## 2021-06-11 NOTE — Discharge Instructions (Signed)
Instructions after Total Hip Replacement     Eriko Economos P. Tanyia Grabbe, Jr., M.D.     Dept. of Orthopaedics & Sports Medicine  [facility]  [address]  [place], [place]  [address]  Phone: [phone]   Fax: [phone]    DIET: . Drink plenty of non-alcoholic fluids. . Resume your normal diet. Include foods high in fiber.  ACTIVITY:  . You may use crutches or a walker with weight-bearing as tolerated, unless instructed otherwise. . You may be weaned off of the walker or crutches by your Physical Therapist.  . Do NOT reach below the level of your knees or cross your legs until allowed.    . Continue doing gentle exercises. Exercising will reduce the pain and swelling, increase motion, and prevent muscle weakness.   . Please continue to use the TED compression stockings for 6 weeks. You may remove the stockings at night, but should reapply them in the morning. . Do not drive or operate any equipment until instructed.  WOUND CARE:  . Continue to use ice packs periodically to reduce pain and swelling. . Keep the incision clean and dry. . You may bathe or shower after the staples are removed at the first office visit following surgery.  MEDICATIONS: . You may resume your regular medications. . Please take the pain medication as prescribed on the medication. . Do not take pain medication on an empty stomach. . You have been given a prescription for a blood thinner to prevent blood clots. Please take the medication as instructed. (NOTE: After completing a 2 week course of Lovenox, take one Enteric-coated aspirin once a day.) . Pain medications and iron supplements can cause constipation. Use a stool softener (Senokot or Colace) on a daily basis and a laxative (dulcolax or miralax) as needed. . Do not drive or drink alcoholic beverages when taking pain medications.  CALL THE OFFICE FOR: . Temperature above 101 degrees . Excessive bleeding or drainage on the dressing. . Excessive  swelling, coldness, or paleness of the toes. . Persistent nausea and vomiting.  FOLLOW-UP:  . You should have an appointment to return to the office in 6 weeks after surgery. . Arrangements have been made for continuation of Physical Therapy (either home therapy or outpatient therapy).     [facility] Department Directory         [email or web address]       [email or web address]          Cardiology  Appointments: [place] - [phone] [place] - [phone]  Endocrinology  Appointments: [place] - [phone] [place] - [phone]  Gastroenterology  Appointments: [place] - [phone] [place] - [phone]        General Surgery   Appointments: [place] - [phone]  Internal Medicine/Family Medicine  Appointments: [place] - [phone] [place] - [phone] [place] - [phone]  Metabolic and Weigh Loss Surgery  Appointments: [place] - [phone]        Neurology  Appointments: [place] - [phone] [place] - [phone]  Neurosurgery  Appointments: [place] - [phone]  Obstetrics & Gynecology  Appointments: [place] - [phone] [place] - [phone]        Pediatrics  Appointments: [place] - [phone] [place] - [phone]  Physiatry  Appointments: [place] -[phone]  Physical Therapy  Appointments: [place] - [phone] [place] - [phone]        Podiatry  Appointments: [place] - [phone] [place] - [phone]  Pulmonology  Appointments: [place] - [phone]  Rheumatology  Appointments: [place] - [phone]        [place] Location: [facility]  [address] [place], [place]  [address]  [place] Location: [facility] [address] [place], [place]  [address]  [place] Location: [facility] [address] [place], [place]  [address]    

## 2021-06-12 ENCOUNTER — Other Ambulatory Visit: Payer: Self-pay

## 2021-06-12 ENCOUNTER — Encounter
Admission: RE | Admit: 2021-06-12 | Discharge: 2021-06-12 | Disposition: A | Discharge status: Home / Self Care | Payer: Medicare Other | Source: Ambulatory Visit | Attending: Orthopedic Surgery | Admitting: Orthopedic Surgery

## 2021-06-12 VITALS — BP 130/64 | HR 61 | Resp 14 | Ht 66.0 in | Wt 206.1 lb

## 2021-06-12 DIAGNOSIS — Z01818 Encounter for other preprocedural examination: Secondary | ICD-10-CM | POA: Diagnosis not present

## 2021-06-12 HISTORY — DX: Gastro-esophageal reflux disease without esophagitis: K21.9

## 2021-06-12 HISTORY — DX: Anxiety disorder, unspecified: F41.9

## 2021-06-12 HISTORY — DX: Spinal stenosis, lumbar region with neurogenic claudication: M48.062

## 2021-06-12 LAB — SURGICAL PCR SCREEN
MRSA, PCR: NEGATIVE
Staphylococcus aureus: NEGATIVE

## 2021-06-12 LAB — URINALYSIS, ROUTINE W REFLEX MICROSCOPIC
Bilirubin Urine: NEGATIVE
Glucose, UA: NEGATIVE mg/dL
Hgb urine dipstick: NEGATIVE
Ketones, ur: NEGATIVE mg/dL
Leukocytes,Ua: NEGATIVE
Nitrite: NEGATIVE
Protein, ur: NEGATIVE mg/dL
Specific Gravity, Urine: 1.014 (ref 1.005–1.030)
pH: 6 (ref 5.0–8.0)

## 2021-06-12 LAB — COMPREHENSIVE METABOLIC PANEL
ALT: 16 U/L (ref 0–44)
AST: 16 U/L (ref 15–41)
Albumin: 4.2 g/dL (ref 3.5–5.0)
Alkaline Phosphatase: 56 U/L (ref 38–126)
Anion gap: 10 (ref 5–15)
BUN: 12 mg/dL (ref 8–23)
CO2: 29 mmol/L (ref 22–32)
Calcium: 9.7 mg/dL (ref 8.9–10.3)
Chloride: 99 mmol/L (ref 98–111)
Creatinine, Ser: 0.69 mg/dL (ref 0.44–1.00)
GFR, Estimated: >60 mL/min (ref >60)
Glucose, Bld: 87 mg/dL (ref 70–99)
Potassium: 3.8 mmol/L (ref 3.5–5.1)
Sodium: 138 mmol/L (ref 135–145)
Total Bilirubin: 0.6 mg/dL (ref 0.3–1.2)
Total Protein: 7.3 g/dL (ref 6.5–8.1)

## 2021-06-12 LAB — CBC WITH DIFFERENTIAL/PLATELET
Abs Immature Granulocytes: 0.01 10*3/uL (ref 0.00–0.07)
Basophils Absolute: 0 10*3/uL (ref 0.0–0.1)
Basophils Relative: 0 %
Eosinophils Absolute: 0.2 10*3/uL (ref 0.0–0.5)
Eosinophils Relative: 3 %
HCT: 45 % (ref 36.0–46.0)
Hemoglobin: 15.3 g/dL — ABNORMAL HIGH (ref 12.0–15.0)
Immature Granulocytes: 0 %
Lymphocytes Relative: 34 %
Lymphs Abs: 1.9 10*3/uL (ref 0.7–4.0)
MCH: 30.7 pg (ref 26.0–34.0)
MCHC: 34 g/dL (ref 30.0–36.0)
MCV: 90.4 fL (ref 80.0–100.0)
Monocytes Absolute: 0.4 10*3/uL (ref 0.1–1.0)
Monocytes Relative: 7 %
Neutro Abs: 3.1 10*3/uL (ref 1.7–7.7)
Neutrophils Relative %: 56 %
Platelets: 179 10*3/uL (ref 150–400)
RBC: 4.98 MIL/uL (ref 3.87–5.11)
RDW: 12.6 % (ref 11.5–15.5)
WBC: 5.5 10*3/uL (ref 4.0–10.5)
nRBC: 0 % (ref 0.0–0.2)

## 2021-06-12 LAB — C-REACTIVE PROTEIN: CRP: 0.6 mg/dL (ref <1.0)

## 2021-06-12 LAB — SEDIMENTATION RATE: Sed Rate: 9 mm/hr (ref 0–30)

## 2021-06-12 NOTE — Patient Instructions (Addendum)
Your procedure is scheduled on:06-14-21 Wednesday ?Report to the Registration Desk on the 1st floor of the [facility].Then proceed to the 2nd floor Surgery Desk in the Medical [facility] ?To find out your arrival time, please call [phone] between 1PM - 3PM on:06-13-21 Tuesday ? ?REMEMBER: ?Instructions that are not followed completely may result in serious medical risk, up to and including death; or upon the discretion of your surgeon and anesthesiologist your surgery may need to be rescheduled. ? ?Do not eat food after midnight the night before surgery.  ?No gum chewing, lozengers or hard candies. ? ?You may however, drink CLEAR liquids up to 2 hours before you are scheduled to arrive for your surgery. Do not drink anything within 2 hours of your scheduled arrival time. ? ?Clear liquids include: ?- water  ?- apple juice without pulp ?- gatorade (not RED colors) ?- black coffee or tea (Do NOT add milk or creamers to the coffee or tea) ?Do NOT drink anything that is not on this list. ? ?In addition, your doctor has ordered for you to drink the provided  ?Ensure Pre-Surgery Clear Carbohydrate Drink ?Drinking this carbohydrate drink up to two hours before surgery helps to reduce insulin resistance and improve patient outcomes. Please complete drinking 2 hours prior to scheduled arrival time. ? ?TAKE THESE MEDICATIONS THE MORNING OF SURGERY WITH A SIP OF WATER: ?-FLUoxetine (PROZAC)  ? ?Aspirin was stopped last week on 06-08-21 as instructed by Dr Ernest Pine ? ?One week prior to surgery: ?Stop Anti-inflammatories (NSAIDS) such as Advil, Aleve, Ibuprofen, Motrin, Naproxen, Naprosyn and Aspirin based products such as Excedrin, Goodys Powder, BC Powder.You may however, take Tylenol if needed for pain up until the day of surgery. Continue your celecoxib (CELEBREX) up until the day prior to surgery ? ?Stop ANY OVER THE COUNTER supplements/vitamins NOW (06-12-21) until after surgery (Calcium Carb-Cholecalciferol, VITAMIN D3, fish  oil-omega-3 fatty acids , HAIR SKIN AND NAILS FORMULA PO, MULTIVITAMIN) ? ?No Alcohol for 24 hours before or after surgery. ? ?No Smoking including e-cigarettes for 24 hours prior to surgery.  ?No chewable tobacco products for at least 6 hours prior to surgery.  ?No nicotine patches on the day of surgery. ? ?Do not use any "recreational" drugs for at least a week prior to your surgery.  ?Please be advised that the combination of cocaine and anesthesia may have negative outcomes, up to and including death. ?If you test positive for cocaine, your surgery will be cancelled. ? ?On the morning of surgery brush your teeth with toothpaste and water, you may rinse your mouth with mouthwash if you wish. ?Do not swallow any toothpaste or mouthwash. ? ?Use CHG Soap as directed on instruction sheet. ? ?Do not wear jewelry, make-up, hairpins, clips or nail polish. ? ?Do not wear lotions, powders, or perfumes.  ? ?Do not shave body from the neck down 48 hours prior to surgery just in case you cut yourself which could leave a site for infection.  ?Also, freshly shaved skin may become irritated if using the CHG soap. ? ?Contact lenses, hearing aids and dentures may not be worn into surgery. ? ?Do not bring valuables to the hospital. [facility] is not responsible for any missing/lost belongings or valuables.  ? ?Notify your doctor if there is any change in your medical condition (cold, fever, infection). ? ?Wear comfortable clothing (specific to your surgery type) to the hospital. ? ?After surgery, you can help prevent lung complications by doing breathing exercises.  ?Take deep  breaths and cough every 1-2 hours. Your doctor may order a device called an Incentive Spirometer to help you take deep breaths. ?When coughing or sneezing, hold a pillow firmly against your incision with both hands. This is called ?splinting.? Doing this helps protect your incision. It also decreases belly discomfort. ? ?If you are being admitted to the  hospital overnight, leave your suitcase in the car. ?After surgery it may be brought to your room. ? ?If you are being discharged the day of surgery, you will not be allowed to drive home. ?You will need a responsible adult (18 years or older) to drive you home and stay with you that night.  ? ?If you are taking public transportation, you will need to have a responsible adult (18 years or older) with you. ?Please confirm with your physician that it is acceptable to use public transportation.  ? ?Please call the Pre-admissions Testing Dept. at [phone] if you have any questions about these instructions. ? ?Surgery Visitation Policy: ? ?Patients undergoing a surgery or procedure may have one family members or support persons with them as long as the person is not COVID-19 positive or experiencing its symptoms.  ? ?Inpatient Visitation:   ? ?Visiting hours are 7 a.m. to 8 p.m. ?Up to two visitors are allowed at one time in a patient room, including children. The visitors may rotate out with other people during the day. One designated support person (adult) may remain overnight. ? ?All Areas: ?All visitors must pass COVID-19 screenings, use hand sanitizer when entering and exiting the patient?s room and wear a mask at all times, including in the patient?s room. ?Patients must also wear a mask when staff or their visitor are in the room. ?Masking is required regardless of vaccination status.  ?

## 2021-06-13 MED ORDER — DEXAMETHASONE SODIUM PHOSPHATE 10 MG/ML IJ SOLN: 8.0000 mg | Freq: Once | INTRAMUSCULAR | Status: AC

## 2021-06-13 MED ORDER — GABAPENTIN 300 MG PO CAPS: 300.0000 mg | ORAL_CAPSULE | Freq: Once | ORAL | Status: AC

## 2021-06-13 MED ORDER — TRANEXAMIC ACID-NACL 1000-0.7 MG/100ML-% IV SOLN: 1000.0000 mg | INTRAVENOUS | Status: DC

## 2021-06-13 MED ORDER — APREPITANT 40 MG PO CAPS
40.0000 mg | ORAL_CAPSULE | Freq: Once | ORAL | Status: AC
Start: 2021-06-13 — End: 2021-06-14

## 2021-06-13 MED ORDER — LACTATED RINGERS IV SOLN: INTRAVENOUS | Status: DC

## 2021-06-13 MED ORDER — ORAL CARE MOUTH RINSE: 15.0000 mL | Freq: Once | OROMUCOSAL | Status: AC

## 2021-06-13 MED ORDER — FAMOTIDINE 20 MG PO TABS: 20.0000 mg | ORAL_TABLET | Freq: Once | ORAL | Status: AC

## 2021-06-13 MED ORDER — CELECOXIB 200 MG PO CAPS: 400.0000 mg | ORAL_CAPSULE | Freq: Once | ORAL | Status: AC

## 2021-06-13 MED ORDER — CEFAZOLIN SODIUM-DEXTROSE 2-4 GM/100ML-% IV SOLN
2.0000 g | INTRAVENOUS | Status: AC
  Administered 2021-06-14 (×2): 2 g via INTRAVENOUS

## 2021-06-13 MED ORDER — CHLORHEXIDINE GLUCONATE 4 % EX LIQD
60.0000 mL | Freq: Once | CUTANEOUS | Status: AC
  Administered 2021-06-14: 4 via TOPICAL

## 2021-06-13 MED ORDER — CHLORHEXIDINE GLUCONATE 0.12 % MT SOLN: 15.0000 mL | Freq: Once | OROMUCOSAL | Status: AC

## 2021-06-14 ENCOUNTER — Encounter: Payer: Self-pay | Admitting: Orthopedic Surgery

## 2021-06-14 ENCOUNTER — Ambulatory Visit: Payer: Medicare Other | Admitting: Urgent Care

## 2021-06-14 ENCOUNTER — Observation Stay: Payer: Medicare Other

## 2021-06-14 ENCOUNTER — Encounter
Admission: RE | Disposition: A | Discharge status: Home Health | Payer: Self-pay | Source: Ambulatory Visit | Attending: Orthopedic Surgery

## 2021-06-14 ENCOUNTER — Other Ambulatory Visit: Payer: Self-pay

## 2021-06-14 ENCOUNTER — Observation Stay
Admission: RE | Admit: 2021-06-14 | Discharge: 2021-06-15 | Disposition: A | Discharge status: Home Health | Payer: Medicare Other | Source: Ambulatory Visit | Attending: Orthopedic Surgery | Admitting: Orthopedic Surgery

## 2021-06-14 DIAGNOSIS — Z791 Long term (current) use of non-steroidal anti-inflammatories (NSAID): Secondary | ICD-10-CM | POA: Diagnosis not present

## 2021-06-14 DIAGNOSIS — Z8249 Family history of ischemic heart disease and other diseases of the circulatory system: Secondary | ICD-10-CM | POA: Insufficient documentation

## 2021-06-14 DIAGNOSIS — Z96642 Presence of left artificial hip joint: Secondary | ICD-10-CM

## 2021-06-14 DIAGNOSIS — M1612 Unilateral primary osteoarthritis, left hip: Secondary | ICD-10-CM | POA: Diagnosis not present

## 2021-06-14 DIAGNOSIS — M549 Dorsalgia, unspecified: Secondary | ICD-10-CM | POA: Insufficient documentation

## 2021-06-14 DIAGNOSIS — Z8719 Personal history of other diseases of the digestive system: Secondary | ICD-10-CM | POA: Diagnosis not present

## 2021-06-14 DIAGNOSIS — Z79899 Other long term (current) drug therapy: Secondary | ICD-10-CM | POA: Diagnosis not present

## 2021-06-14 DIAGNOSIS — M6281 Muscle weakness (generalized): Secondary | ICD-10-CM | POA: Insufficient documentation

## 2021-06-14 DIAGNOSIS — Z86010 Personal history of colonic polyps: Secondary | ICD-10-CM | POA: Insufficient documentation

## 2021-06-14 DIAGNOSIS — F419 Anxiety disorder, unspecified: Secondary | ICD-10-CM | POA: Insufficient documentation

## 2021-06-14 DIAGNOSIS — R110 Nausea: Secondary | ICD-10-CM | POA: Insufficient documentation

## 2021-06-14 DIAGNOSIS — Z6834 Body mass index (BMI) 34.0-34.9, adult: Secondary | ICD-10-CM | POA: Diagnosis not present

## 2021-06-14 DIAGNOSIS — G8929 Other chronic pain: Secondary | ICD-10-CM | POA: Diagnosis not present

## 2021-06-14 DIAGNOSIS — R42 Dizziness and giddiness: Secondary | ICD-10-CM | POA: Insufficient documentation

## 2021-06-14 DIAGNOSIS — Z7982 Long term (current) use of aspirin: Secondary | ICD-10-CM | POA: Insufficient documentation

## 2021-06-14 DIAGNOSIS — Z98891 History of uterine scar from previous surgery: Secondary | ICD-10-CM | POA: Diagnosis not present

## 2021-06-14 DIAGNOSIS — M25752 Osteophyte, left hip: Secondary | ICD-10-CM | POA: Diagnosis not present

## 2021-06-14 DIAGNOSIS — E785 Hyperlipidemia, unspecified: Secondary | ICD-10-CM | POA: Insufficient documentation

## 2021-06-14 DIAGNOSIS — R2681 Unsteadiness on feet: Secondary | ICD-10-CM | POA: Insufficient documentation

## 2021-06-14 DIAGNOSIS — M25552 Pain in left hip: Secondary | ICD-10-CM | POA: Insufficient documentation

## 2021-06-14 DIAGNOSIS — F32A Depression, unspecified: Secondary | ICD-10-CM | POA: Diagnosis not present

## 2021-06-14 DIAGNOSIS — M48062 Spinal stenosis, lumbar region with neurogenic claudication: Secondary | ICD-10-CM | POA: Diagnosis not present

## 2021-06-14 DIAGNOSIS — Z90710 Acquired absence of both cervix and uterus: Secondary | ICD-10-CM | POA: Insufficient documentation

## 2021-06-14 DIAGNOSIS — E669 Obesity, unspecified: Secondary | ICD-10-CM | POA: Insufficient documentation

## 2021-06-14 DIAGNOSIS — I10 Essential (primary) hypertension: Secondary | ICD-10-CM | POA: Diagnosis not present

## 2021-06-14 DIAGNOSIS — R2689 Other abnormalities of gait and mobility: Secondary | ICD-10-CM | POA: Insufficient documentation

## 2021-06-14 DIAGNOSIS — Z96659 Presence of unspecified artificial knee joint: Secondary | ICD-10-CM | POA: Insufficient documentation

## 2021-06-14 DIAGNOSIS — Z90722 Acquired absence of ovaries, bilateral: Secondary | ICD-10-CM | POA: Insufficient documentation

## 2021-06-14 DIAGNOSIS — R102 Pelvic and perineal pain: Secondary | ICD-10-CM | POA: Diagnosis not present

## 2021-06-14 DIAGNOSIS — I959 Hypotension, unspecified: Secondary | ICD-10-CM | POA: Insufficient documentation

## 2021-06-14 HISTORY — PX: TOTAL HIP ARTHROPLASTY: SHX124

## 2021-06-14 LAB — TYPE AND SCREEN
ABO/RH(D): O POS
Antibody Screen: NEGATIVE

## 2021-06-14 SURGERY — ARTHROPLASTY, HIP, TOTAL,POSTERIOR APPROACH
Anesthesia: Spinal | Site: Hip | Laterality: Left

## 2021-06-14 MED ORDER — CEFAZOLIN SODIUM-DEXTROSE 2-4 GM/100ML-% IV SOLN
INTRAVENOUS | Status: AC
  Administered 2021-06-14: 2 g via INTRAVENOUS
  Filled 2021-06-14: qty 100

## 2021-06-14 MED ORDER — SODIUM CHLORIDE 0.9 % IV SOLN: INTRAVENOUS | Status: DC

## 2021-06-14 MED ORDER — SODIUM CHLORIDE 0.9 % IR SOLN: Status: DC | PRN

## 2021-06-14 MED ORDER — CEFAZOLIN SODIUM-DEXTROSE 2-4 GM/100ML-% IV SOLN
INTRAVENOUS | Status: AC
  Filled 2021-06-14: qty 100

## 2021-06-14 MED ORDER — ACETAMINOPHEN 10 MG/ML IV SOLN
INTRAVENOUS | Status: AC
  Administered 2021-06-14: 1000 mg via INTRAVENOUS
  Filled 2021-06-14: qty 100

## 2021-06-14 MED ORDER — DIPHENHYDRAMINE HCL 12.5 MG/5ML PO ELIX: 12.5000 mg | ORAL_SOLUTION | ORAL | Status: DC | PRN

## 2021-06-14 MED ORDER — GABAPENTIN 300 MG PO CAPS
ORAL_CAPSULE | ORAL | Status: AC
  Administered 2021-06-14: 300 mg via ORAL
  Filled 2021-06-14: qty 1

## 2021-06-14 MED ORDER — ADULT MULTIVITAMIN W/MINERALS CH
1.0000 | ORAL_TABLET | Freq: Every day | ORAL | Status: DC
  Administered 2021-06-15: 1 via ORAL
  Filled 2021-06-14: qty 1

## 2021-06-14 MED ORDER — PROPOFOL 10 MG/ML IV BOLUS
INTRAVENOUS | Status: DC | PRN
Start: 2021-06-14 — End: 2021-06-14
  Administered 2021-06-14: 20 mg via INTRAVENOUS

## 2021-06-14 MED ORDER — OXYCODONE HCL 5 MG PO TABS
10.0000 mg | ORAL_TABLET | ORAL | Status: DC | PRN
  Administered 2021-06-15: 10 mg via ORAL

## 2021-06-14 MED ORDER — PHENYLEPHRINE HCL-NACL 20-0.9 MG/250ML-% IV SOLN
INTRAVENOUS | Status: AC
  Filled 2021-06-14: qty 250

## 2021-06-14 MED ORDER — FENTANYL CITRATE (PF) 100 MCG/2ML IJ SOLN
25.0000 ug | INTRAMUSCULAR | Status: DC | PRN
  Administered 2021-06-14: 25 ug via INTRAVENOUS

## 2021-06-14 MED ORDER — TRANEXAMIC ACID-NACL 1000-0.7 MG/100ML-% IV SOLN
INTRAVENOUS | Status: AC
  Administered 2021-06-14: 1000 mg via INTRAVENOUS
  Filled 2021-06-14: qty 100

## 2021-06-14 MED ORDER — MIDAZOLAM HCL 5 MG/5ML IJ SOLN
INTRAMUSCULAR | Status: DC | PRN
Start: 2021-06-14 — End: 2021-06-14
  Administered 2021-06-14: 2 mg via INTRAVENOUS

## 2021-06-14 MED ORDER — MENTHOL 3 MG MT LOZG: 1.0000 | LOZENGE | OROMUCOSAL | Status: DC | PRN

## 2021-06-14 MED ORDER — FERROUS SULFATE 325 (65 FE) MG PO TABS: 325.0000 mg | ORAL_TABLET | Freq: Two times a day (BID) | ORAL | Status: DC

## 2021-06-14 MED ORDER — ENOXAPARIN SODIUM 30 MG/0.3ML IJ SOSY
30.0000 mg | PREFILLED_SYRINGE | Freq: Two times a day (BID) | INTRAMUSCULAR | Status: DC
  Administered 2021-06-15: 30 mg via SUBCUTANEOUS

## 2021-06-14 MED ORDER — SODIUM CHLORIDE 0.9 % IR SOLN
Status: DC | PRN
  Administered 2021-06-14: 3000 mL

## 2021-06-14 MED ORDER — PANTOPRAZOLE SODIUM 40 MG PO TBEC
40.0000 mg | DELAYED_RELEASE_TABLET | Freq: Two times a day (BID) | ORAL | Status: DC
  Administered 2021-06-15: 40 mg via ORAL

## 2021-06-14 MED ORDER — ALUM & MAG HYDROXIDE-SIMETH 200-200-20 MG/5ML PO SUSP: 30.0000 mL | ORAL | Status: DC | PRN

## 2021-06-14 MED ORDER — BISACODYL 10 MG RE SUPP
10.0000 mg | Freq: Every day | RECTAL | Status: DC | PRN
Start: 2021-06-14 — End: 2021-06-15
  Filled 2021-06-14: qty 1

## 2021-06-14 MED ORDER — CEFAZOLIN SODIUM-DEXTROSE 2-4 GM/100ML-% IV SOLN: 2.0000 g | Freq: Four times a day (QID) | INTRAVENOUS | Status: AC

## 2021-06-14 MED ORDER — HYDROMORPHONE HCL 1 MG/ML IJ SOLN: 0.5000 mg | INTRAMUSCULAR | Status: DC | PRN

## 2021-06-14 MED ORDER — OXYCODONE HCL 5 MG/5ML PO SOLN: 5.0000 mg | Freq: Once | ORAL | Status: DC | PRN

## 2021-06-14 MED ORDER — MIDAZOLAM HCL 2 MG/2ML IJ SOLN
INTRAMUSCULAR | Status: AC
  Filled 2021-06-14: qty 2

## 2021-06-14 MED ORDER — ACETAMINOPHEN 10 MG/ML IV SOLN
INTRAVENOUS | Status: DC | PRN
  Administered 2021-06-14: 1000 mg via INTRAVENOUS

## 2021-06-14 MED ORDER — ENSURE PRE-SURGERY PO LIQD
296.0000 mL | Freq: Once | ORAL | Status: AC
  Administered 2021-06-14: 296 mL via ORAL
  Filled 2021-06-14: qty 296

## 2021-06-14 MED ORDER — CALCIUM CARBONATE ANTACID 500 MG PO CHEW
1.0000 | CHEWABLE_TABLET | ORAL | Status: DC | PRN
  Filled 2021-06-14: qty 1

## 2021-06-14 MED ORDER — OXYCODONE HCL 5 MG PO TABS: 5.0000 mg | ORAL_TABLET | Freq: Once | ORAL | Status: DC | PRN

## 2021-06-14 MED ORDER — CHLORHEXIDINE GLUCONATE 0.12 % MT SOLN
OROMUCOSAL | Status: AC
  Administered 2021-06-14: 15 mL via OROMUCOSAL
  Filled 2021-06-14: qty 15

## 2021-06-14 MED ORDER — SURGIPHOR WOUND IRRIGATION SYSTEM - OPTIME: TOPICAL | Status: DC | PRN

## 2021-06-14 MED ORDER — OYSTER SHELL CALCIUM/D3 500-5 MG-MCG PO TABS: 1.0000 | ORAL_TABLET | Freq: Every day | ORAL | Status: DC

## 2021-06-14 MED ORDER — ONDANSETRON HCL 4 MG PO TABS: 4.0000 mg | ORAL_TABLET | Freq: Four times a day (QID) | ORAL | Status: DC | PRN

## 2021-06-14 MED ORDER — ONDANSETRON HCL 4 MG/2ML IJ SOLN: 4.0000 mg | Freq: Four times a day (QID) | INTRAMUSCULAR | Status: DC | PRN

## 2021-06-14 MED ORDER — FLEET ENEMA 7-19 GM/118ML RE ENEM: 1.0000 | ENEMA | Freq: Once | RECTAL | Status: DC | PRN

## 2021-06-14 MED ORDER — VITAMIN D3 25 MCG (1000 UNIT) PO TABS
1000.0000 [IU] | ORAL_TABLET | Freq: Every day | ORAL | Status: DC
  Filled 2021-06-14: qty 1

## 2021-06-14 MED ORDER — OMEGA-3-ACID ETHYL ESTERS 1 G PO CAPS
1.0000 g | ORAL_CAPSULE | Freq: Every day | ORAL | Status: DC
  Filled 2021-06-14: qty 1

## 2021-06-14 MED ORDER — CELECOXIB 200 MG PO CAPS
ORAL_CAPSULE | ORAL | Status: AC
  Administered 2021-06-14: 400 mg via ORAL
  Filled 2021-06-14: qty 2

## 2021-06-14 MED ORDER — PHENYLEPHRINE HCL-NACL 20-0.9 MG/250ML-% IV SOLN
INTRAVENOUS | Status: DC | PRN
Start: 2021-06-14 — End: 2021-06-14
  Administered 2021-06-14: 50 ug/min via INTRAVENOUS

## 2021-06-14 MED ORDER — SENNOSIDES-DOCUSATE SODIUM 8.6-50 MG PO TABS
1.0000 | ORAL_TABLET | Freq: Two times a day (BID) | ORAL | Status: DC
Start: 2021-06-14 — End: 2021-06-15

## 2021-06-14 MED ORDER — FLUOXETINE HCL 20 MG PO CAPS
20.0000 mg | ORAL_CAPSULE | ORAL | Status: DC
  Administered 2021-06-15: 20 mg via ORAL
  Filled 2021-06-14: qty 1

## 2021-06-14 MED ORDER — ONDANSETRON HCL 4 MG/2ML IJ SOLN
INTRAMUSCULAR | Status: DC | PRN
  Administered 2021-06-14: 4 mg via INTRAVENOUS

## 2021-06-14 MED ORDER — PANTOPRAZOLE SODIUM 40 MG PO TBEC
DELAYED_RELEASE_TABLET | ORAL | Status: AC
  Administered 2021-06-14: 40 mg via ORAL
  Filled 2021-06-14: qty 1

## 2021-06-14 MED ORDER — PROPOFOL 500 MG/50ML IV EMUL
INTRAVENOUS | Status: DC | PRN
  Administered 2021-06-14: 100 ug/kg/min via INTRAVENOUS

## 2021-06-14 MED ORDER — ACETAMINOPHEN 10 MG/ML IV SOLN: 1000.0000 mg | Freq: Once | INTRAVENOUS | Status: DC | PRN

## 2021-06-14 MED ORDER — FAMOTIDINE 20 MG PO TABS
ORAL_TABLET | ORAL | Status: AC
  Administered 2021-06-14: 20 mg via ORAL
  Filled 2021-06-14: qty 1

## 2021-06-14 MED ORDER — TRAMADOL HCL 50 MG PO TABS
ORAL_TABLET | ORAL | Status: AC
  Filled 2021-06-14: qty 1

## 2021-06-14 MED ORDER — ROSUVASTATIN CALCIUM 5 MG PO TABS
5.0000 mg | ORAL_TABLET | Freq: Every day | ORAL | Status: DC
  Filled 2021-06-14 (×3): qty 1

## 2021-06-14 MED ORDER — TRANEXAMIC ACID-NACL 1000-0.7 MG/100ML-% IV SOLN
INTRAVENOUS | Status: DC | PRN
  Administered 2021-06-14: 1000 mg via INTRAVENOUS

## 2021-06-14 MED ORDER — EPHEDRINE SULFATE (PRESSORS) 50 MG/ML IJ SOLN
INTRAMUSCULAR | Status: DC | PRN
  Administered 2021-06-14: 5 mg via INTRAVENOUS
  Administered 2021-06-14: 10 mg via INTRAVENOUS
  Administered 2021-06-14: 5 mg via INTRAVENOUS
  Administered 2021-06-14: 10 mg via INTRAVENOUS

## 2021-06-14 MED ORDER — CELECOXIB 200 MG PO CAPS
ORAL_CAPSULE | ORAL | Status: AC
  Administered 2021-06-14: 200 mg via ORAL
  Filled 2021-06-14: qty 1

## 2021-06-14 MED ORDER — BENAZEPRIL HCL 20 MG PO TABS
20.0000 mg | ORAL_TABLET | Freq: Every day | ORAL | Status: DC
  Administered 2021-06-15: 20 mg via ORAL
  Filled 2021-06-14: qty 1

## 2021-06-14 MED ORDER — FENTANYL CITRATE (PF) 100 MCG/2ML IJ SOLN
INTRAMUSCULAR | Status: AC
  Administered 2021-06-14: 25 ug via INTRAVENOUS
  Filled 2021-06-14: qty 2

## 2021-06-14 MED ORDER — CELECOXIB 200 MG PO CAPS
200.0000 mg | ORAL_CAPSULE | Freq: Two times a day (BID) | ORAL | Status: DC
  Administered 2021-06-15: 200 mg via ORAL

## 2021-06-14 MED ORDER — PHENYLEPHRINE HCL (PRESSORS) 10 MG/ML IV SOLN
INTRAVENOUS | Status: AC
  Filled 2021-06-14: qty 1

## 2021-06-14 MED ORDER — TRANEXAMIC ACID 1000 MG/10ML IV SOLN
INTRAVENOUS | Status: AC
  Filled 2021-06-14: qty 10

## 2021-06-14 MED ORDER — APREPITANT 40 MG PO CAPS
ORAL_CAPSULE | ORAL | Status: AC
  Administered 2021-06-14: 40 mg via ORAL
  Filled 2021-06-14: qty 1

## 2021-06-14 MED ORDER — TRAMADOL HCL 50 MG PO TABS: 50.0000 mg | ORAL_TABLET | ORAL | Status: DC | PRN

## 2021-06-14 MED ORDER — TRANEXAMIC ACID-NACL 1000-0.7 MG/100ML-% IV SOLN: 1000.0000 mg | Freq: Once | INTRAVENOUS | Status: AC

## 2021-06-14 MED ORDER — METOCLOPRAMIDE HCL 10 MG PO TABS
10.0000 mg | ORAL_TABLET | Freq: Three times a day (TID) | ORAL | Status: DC
  Administered 2021-06-15 (×2): 10 mg via ORAL

## 2021-06-14 MED ORDER — BUPIVACAINE HCL (PF) 0.5 % IJ SOLN
INTRAMUSCULAR | Status: DC | PRN
  Administered 2021-06-14: 3 mL

## 2021-06-14 MED ORDER — PHENOL 1.4 % MT LIQD: 1.0000 | OROMUCOSAL | Status: DC | PRN

## 2021-06-14 MED ORDER — AMLODIPINE BESYLATE 5 MG PO TABS
5.0000 mg | ORAL_TABLET | Freq: Every day | ORAL | Status: DC
  Administered 2021-06-15: 5 mg via ORAL
  Filled 2021-06-14: qty 1

## 2021-06-14 MED ORDER — METOCLOPRAMIDE HCL 10 MG PO TABS
ORAL_TABLET | ORAL | Status: AC
  Administered 2021-06-14: 10 mg via ORAL
  Filled 2021-06-14: qty 1

## 2021-06-14 MED ORDER — MAGNESIUM HYDROXIDE 400 MG/5ML PO SUSP: 30.0000 mL | Freq: Every day | ORAL | Status: DC

## 2021-06-14 MED ORDER — OXYCODONE HCL 5 MG PO TABS: 5.0000 mg | ORAL_TABLET | ORAL | Status: DC | PRN

## 2021-06-14 MED ORDER — ACETAMINOPHEN 10 MG/ML IV SOLN
1000.0000 mg | Freq: Four times a day (QID) | INTRAVENOUS | Status: AC
  Administered 2021-06-15 (×2): 1000 mg via INTRAVENOUS

## 2021-06-14 MED ORDER — PHENYLEPHRINE HCL (PRESSORS) 10 MG/ML IV SOLN
INTRAVENOUS | Status: DC | PRN
  Administered 2021-06-14 (×4): 100 ug via INTRAVENOUS

## 2021-06-14 MED ORDER — DEXAMETHASONE SODIUM PHOSPHATE 10 MG/ML IJ SOLN
INTRAMUSCULAR | Status: AC
  Administered 2021-06-14: 8 mg via INTRAVENOUS
  Filled 2021-06-14: qty 1

## 2021-06-14 MED ORDER — ACETAMINOPHEN 325 MG PO TABS: 325.0000 mg | ORAL_TABLET | Freq: Four times a day (QID) | ORAL | Status: DC | PRN

## 2021-06-14 SURGICAL SUPPLY — 65 items
BLADE CLIPPER SURG (BLADE) ×1 IMPLANT
BLADE DRUM FLTD (BLADE) ×3 IMPLANT
BLADE SAW 90X25X1.19 OSCILLAT (BLADE) ×3 IMPLANT
CARTRIDGE OIL MAESTRO DRILL (MISCELLANEOUS) ×2 IMPLANT
CUP ACETBLR 52 OD 100 SERIES (Hips) ×1 IMPLANT
DIFFUSER DRILL AIR PNEUMATIC (MISCELLANEOUS) ×3 IMPLANT
DRAPE 3/4 80X56 (DRAPES) ×3 IMPLANT
DRAPE INCISE IOBAN 66X60 STRL (DRAPES) ×3 IMPLANT
DRSG DERMACEA 8X12 NADH (GAUZE/BANDAGES/DRESSINGS) ×3 IMPLANT
DRSG MEPILEX SACRM 8.7X9.8 (GAUZE/BANDAGES/DRESSINGS) ×3 IMPLANT
DRSG OPSITE POSTOP 4X12 (GAUZE/BANDAGES/DRESSINGS) ×3 IMPLANT
DRSG OPSITE POSTOP 4X14 (GAUZE/BANDAGES/DRESSINGS) IMPLANT
DRSG TEGADERM 4X4.75 (GAUZE/BANDAGES/DRESSINGS) ×3 IMPLANT
DURAPREP 26ML APPLICATOR (WOUND CARE) ×4 IMPLANT
ELECT CAUTERY BLADE 6.4 (BLADE) ×3 IMPLANT
ELECT REM PT RETURN 9FT ADLT (ELECTROSURGICAL) ×2
ELECTRODE REM PT RTRN 9FT ADLT (ELECTROSURGICAL) ×2 IMPLANT
GAUZE 4X4 16PLY ~~LOC~~+RFID DBL (SPONGE) ×2 IMPLANT
GLOVE SURG ENC MOIS LTX SZ7.5 (GLOVE) ×6 IMPLANT
GLOVE SURG ENC TEXT LTX SZ7.5 (GLOVE) ×6 IMPLANT
GLOVE SURG UNDER LTX SZ8 (GLOVE) ×3 IMPLANT
GLOVE SURG UNDER POLY LF SZ7.5 (GLOVE) ×3 IMPLANT
GOWN STRL REUS W/ TWL LRG LVL3 (GOWN DISPOSABLE) ×4 IMPLANT
GOWN STRL REUS W/ TWL XL LVL3 (GOWN DISPOSABLE) ×2 IMPLANT
GOWN STRL REUS W/TWL LRG LVL3 (GOWN DISPOSABLE) ×4
GOWN STRL REUS W/TWL XL LVL3 (GOWN DISPOSABLE) ×2
HEAD M SROM 36MM PLUS 1.5 (Hips) IMPLANT
HEMOVAC 400CC 10FR (MISCELLANEOUS) ×3 IMPLANT
HOLDER FOLEY CATH W/STRAP (MISCELLANEOUS) ×3 IMPLANT
HOLSTER ELECTROSUGICAL PENCIL (MISCELLANEOUS) ×5 IMPLANT
HOOD PEEL AWAY FLYTE STAYCOOL (MISCELLANEOUS) ×2 IMPLANT
IV NS IRRIG 3000ML ARTHROMATIC (IV SOLUTION) ×3 IMPLANT
KIT PEG BOARD PINK (KITS) ×3 IMPLANT
KIT TURNOVER KIT A (KITS) ×3 IMPLANT
LINER ACETAB NEUTRAL 36ID 520D (Liner) ×1 IMPLANT
MANIFOLD NEPTUNE II (INSTRUMENTS) ×6 IMPLANT
NDL SAFETY ECLIPSE 18X1.5 (NEEDLE) ×2 IMPLANT
NEEDLE HYPO 18GX1.5 SHARP (NEEDLE)
NS IRRIG 500ML POUR BTL (IV SOLUTION) ×2 IMPLANT
OIL CARTRIDGE MAESTRO DRILL (MISCELLANEOUS) ×2
PACK HIP PROSTHESIS (MISCELLANEOUS) ×3 IMPLANT
PENCIL SMOKE EVACUATOR COATED (MISCELLANEOUS) ×2 IMPLANT
PULSAVAC PLUS IRRIG FAN TIP (DISPOSABLE) ×2
SOL PREP PVP 2OZ (MISCELLANEOUS) ×2
SOLUTION IRRIG SURGIPHOR (IV SOLUTION) ×1 IMPLANT
SOLUTION PREP PVP 2OZ (MISCELLANEOUS) ×2 IMPLANT
SOLUTION PRONTOSAN WOUND 350ML (IRRIGATION / IRRIGATOR) ×2 IMPLANT
SPONGE DRAIN TRACH 4X4 STRL 2S (GAUZE/BANDAGES/DRESSINGS) ×3 IMPLANT
SPONGE T-LAP 18X18 ~~LOC~~+RFID (SPONGE) ×9 IMPLANT
SROM M HEAD 36MM PLUS 1.5 (Hips) ×2 IMPLANT
STAPLER SKIN PROX 35W (STAPLE) ×3 IMPLANT
STEM FEM CMNTLSS SM AML 15.0 (Hips) ×1 IMPLANT
SUT ETHIBOND #5 BRAIDED 30INL (SUTURE) ×3 IMPLANT
SUT VIC AB 0 CT1 36 (SUTURE) ×4 IMPLANT
SUT VIC AB 1 CT1 36 (SUTURE) ×6 IMPLANT
SUT VIC AB 2-0 CT1 27 (SUTURE) ×2
SUT VIC AB 2-0 CT1 TAPERPNT 27 (SUTURE) ×2 IMPLANT
SYR 20ML LL LF (SYRINGE) ×3 IMPLANT
TAPE CLOTH 3X10 WHT NS LF (GAUZE/BANDAGES/DRESSINGS) ×3 IMPLANT
TAPE TRANSPORE STRL 2 31045 (GAUZE/BANDAGES/DRESSINGS) ×3 IMPLANT
TIP FAN IRRIG PULSAVAC PLUS (DISPOSABLE) ×2 IMPLANT
TOWEL OR 17X26 4PK STRL BLUE (TOWEL DISPOSABLE) ×2 IMPLANT
TRAY FOLEY MTR SLVR 16FR STAT (SET/KITS/TRAYS/PACK) ×3 IMPLANT
TUBE KAMVAC SUCTION (TUBING) ×3 IMPLANT
WATER STERILE IRR 500ML POUR (IV SOLUTION) ×2 IMPLANT

## 2021-06-14 NOTE — Anesthesia Postprocedure Evaluation (Signed)
Anesthesia Post Note ? ?Patient: Tina Petty ? ?Procedure(s) Performed: TOTAL HIP ARTHROPLASTY (Left: Hip) ? ?Patient location during evaluation: PACU ?Anesthesia Type: Spinal ?Level of consciousness: awake and alert, oriented and patient cooperative ?Pain management: pain level controlled ?Vital Signs Assessment: post-procedure vital signs reviewed and stable ?Respiratory status: spontaneous breathing, nonlabored ventilation and respiratory function stable ?Cardiovascular status: blood pressure returned to baseline and stable ?Postop Assessment: adequate PO intake ?Anesthetic complications: no ? ? ?No notable events documented. ? ? ?Last Vitals:  ?Vitals:  ? 06/14/21 1715 06/14/21 1730  ?BP: (!) 108/47 (!) 93/47  ?Pulse: 61 60  ?Resp: 14 17  ?Temp:    ?SpO2: 98% 96%  ?  ?Last Pain:  ?Vitals:  ? 06/14/21 1730  ?TempSrc:   ?PainSc: 4   ? ? ?  ?  ?  ?  ?  ?  ? ?Darrin Nipper ? ? ? ? ?

## 2021-06-14 NOTE — H&P (Signed)
The patient has been re-examined, and the chart reviewed, and there have been no interval changes to the documented history and physical.    The risks, benefits, and alternatives have been discussed at length. The patient expressed understanding of the risks benefits and agreed with plans for surgical intervention.  Tina Petty, Jr. M.D.    

## 2021-06-14 NOTE — Op Note (Addendum)
OPERATIVE NOTE ? ?DATE OF SURGERY:  06/14/2021 ? ?PATIENT NAME:  Tina Petty   ?DOB: 1950/12/03  ?MRN: [id number] ? ?PRE-OPERATIVE DIAGNOSIS: Degenerative arthrosis of the left hip, primary ? ?POST-OPERATIVE DIAGNOSIS:  Same ? ?PROCEDURE:  Left total hip arthroplasty ? ?SURGEON:  Jena Gauss. M.D. ? ?ASSISTANT: Baldwin Jamaica, PA-C (present and scrubbed throughout the case, critical for assistance with exposure, retraction, instrumentation, and closure) ? ?ANESTHESIA: spinal ? ?ESTIMATED BLOOD LOSS: 300 mL ? ?FLUIDS REPLACED: 1000 mL of crystalloid ? ?DRAINS: 2 medium Hemovac drains ? ?IMPLANTS UTILIZED: DePuy 15 mm small stature AML femoral stem, 52 mm OD Pinnacle 100 acetabular component, neutral Pinnacle Altrx polyethylene insert, and a 36 mm M-SPEC +1.5 mm hip ball ? ?INDICATIONS FOR SURGERY: VETA DAMBROSIA is a 71 y.o. year old female with a long history of progressive hip and groin  pain. X-rays demonstrated severe degenerative changes. The patient had not seen any significant improvement despite conservative nonsurgical intervention. After discussion of the risks and benefits of surgical intervention, the patient expressed understanding of the risks benefits and agree with plans for total hip arthroplasty.  ? ?The risks, benefits, and alternatives were discussed at length including but not limited to the risks of infection, bleeding, nerve injury, stiffness, blood clots, the need for revision surgery, limb length inequality, dislocation, cardiopulmonary complications, among others, and they were willing to proceed. ? ?PROCEDURE IN DETAIL: The patient was brought into the operating room and, after adequate spinal anesthesia was achieved, the patient was placed in a right lateral decubitus position. Axillary roll was placed and all bony prominences were well-padded. The patient's left hip was cleaned and prepped with alcohol and DuraPrep and draped in the usual sterile fashion. A "timeout" was performed as  per usual protocol. A lateral curvilinear incision was made gently curving towards the posterior superior iliac spine. The IT band was incised in line with the skin incision and the fibers of the gluteus maximus were split in line. The piriformis tendon was identified, skeletonized, and incised at its insertion to the proximal femur and reflected posteriorly. A T type posterior capsulotomy was performed. Prior to dislocation of the femoral head, a threaded Steinmann pin was inserted through a separate stab incision into the pelvis superior to the acetabulum and bent in the form of a stylus so as to assess limb length and hip offset throughout the procedure. The femoral head was then dislocated posteriorly. Inspection of the femoral head demonstrated severe degenerative changes with full-thickness loss of articular cartilage. The femoral neck cut was performed using an oscillating saw. The anterior capsule was elevated off of the femoral neck using a periosteal elevator. Attention was then directed to the acetabulum. The remnant of the labrum was excised using electrocautery. Inspection of the acetabulum also demonstrated significant degenerative changes. The acetabulum was reamed in sequential fashion up to a 51 mm diameter. Good punctate bleeding bone was encountered. A 52 mm Pinnacle 100 acetabular component was positioned and impacted into place. Good scratch fit was appreciated. A neutral polyethylene trial was inserted. ? ?Attention was then directed to the proximal femur. A hole for reaming of the proximal femoral canal was created using a high-speed burr. The femoral canal was reamed in sequential fashion up to a 14.5 mm diameter. This allowed for approximately 5 cm of scratch fit. Serial broaches were inserted up to a 15 mm small stature femoral broach. Calcar region was planed and a trial reduction was performed using a 36 mm  hip ball with a +1.5 mm neck length. Good equalization of limb lengths and hip  offset was appreciated and excellent stability was noted both anteriorly and posteriorly. Trial components were removed. The acetabular shell was irrigated with copious amounts of normal saline with antibiotic solution and suctioned dry. A neutral Pinnacle Altrx polyethylene insert was positioned and impacted into place. Next, a 15 mm small stature AML femoral stem was positioned and impacted into place. Excellent scratch fit was appreciated. A trial reduction was again performed with a 36 mm hip ball with a +1.5 mm neck length. Again, good equalization of limb lengths was appreciated and excellent stability appreciated both anteriorly and posteriorly. The hip was then dislocated and the trial hip ball was removed. The Morse taper was cleaned and dried. A 36 mm M-SPEC hip ball with a +1.5 mm neck length was placed on the trunnion and impacted into place. The hip was then reduced and placed through range of motion. Excellent stability was appreciated both anteriorly and posteriorly. ? ?The wound was irrigated with copious amounts of normal saline followed by 450 ml of Surgiphor and suctioned dry. Good hemostasis was appreciated. The posterior capsulotomy was repaired using #5 Ethibond. Piriformis tendon was reapproximated to the undersurface of the gluteus medius tendon using #5 Ethibond. The IT band was reapproximated using interrupted sutures of #1 Vicryl. Subcutaneous tissue was approximated using first #0 Vicryl followed by #2-0 Vicryl. The skin was closed with skin staples. ? ?The patient tolerated the procedure well and was transported to the recovery room in stable condition.  ? ?Jena Gauss., M.D. ? ?

## 2021-06-14 NOTE — Progress Notes (Signed)
PT Cancellation Note ? ?Patient Details ?Name: Tina Petty ?MRN: [id number] ?DOB: 08-Jan-1951 ? ? ?Cancelled Treatment:    Reason Eval/Treat Not Completed: Patient not medically ready ?Spoke with nurses in post-op, pt just arriving from x-ray in a lot of pain.  Pt not appropriate for POD0 PT exam, will initiate PT services tomorrow AM. ? ?Malachi Pro, DPT ?06/14/2021, 4:45 PM ?

## 2021-06-14 NOTE — Transfer of Care (Signed)
Immediate Anesthesia Transfer of Care Note ? ?Patient: Tina Petty ? ?Procedure(s) Performed: TOTAL HIP ARTHROPLASTY (Left: Hip) ? ?Patient Location: PACU ? ?Anesthesia Type:Spinal ? ?Level of Consciousness: drowsy ? ?Airway & Oxygen Therapy: Patient Spontanous Breathing ? ?Post-op Assessment: Report given to RN and Post -op Vital signs reviewed and stable ? ?Post vital signs: Reviewed and stable ? ?Last Vitals:  ?Vitals Value Taken Time  ?BP 129/54 06/14/21 1630  ?Temp 36.2 ?C 06/14/21 1630  ?Pulse 61 06/14/21 1636  ?Resp 15 06/14/21 1636  ?SpO2 100 % 06/14/21 1636  ?Vitals shown include unvalidated device data. ? ?Last Pain:  ?Vitals:  ? 06/14/21 1630  ?TempSrc:   ?PainSc: 5   ?   ? ?  ? ?Complications: No notable events documented. ?

## 2021-06-14 NOTE — Anesthesia Preprocedure Evaluation (Addendum)
Anesthesia Evaluation  ?Patient identified by MRN, date of birth, ID band ?Patient awake ? ? ? ?Reviewed: ?Allergy & Precautions, H&P , NPO status , Patient's Chart, lab work & pertinent test results, reviewed documented beta blocker date and time  ? ?History of Anesthesia Complications ?(+) PONV and history of anesthetic complications ? ?Airway ?Mallampati: II ? ? ?Neck ROM: full ? ? ? Dental ? ?(+) Teeth Intact ?  ?Pulmonary ?neg pulmonary ROS,  ?  ?Pulmonary exam normal ? ? ? ? ? ? ? Cardiovascular ?Exercise Tolerance: Poor ?hypertension, On Medications ?Normal cardiovascular exam ?Rhythm:regular Rate:Normal ? ? ?  ?Neuro/Psych ?PSYCHIATRIC DISORDERS Anxiety Depression Spinal stenosis of lumbar region with neurogenic claudication ?  ? GI/Hepatic ?negative GI ROS, Neg liver ROS,   ?Endo/Other  ?negative endocrine ROS ? Renal/GU ?negative Renal ROS  ?negative genitourinary ?  ?Musculoskeletal ? ? Abdominal ?(+) + obese,   ?Peds ? Hematology ?negative hematology ROS ?(+)   ?Anesthesia Other Findings ?Past Medical History: ?No date: Arthritis ?No date: Depression ?No date: Hx of dizziness ?No date: Hyperlipidemia ?No date: Hypertension ?No date: PONV (postoperative nausea and vomiting) ?    Comment:  not recently ?Past Surgical History: ?No date: ABDOMINAL HYSTERECTOMY ?No date: adenomatours colon polyp ?1990: BREAST BIOPSY; Right ?    Comment:  neg- papiloma ?No date: BREAST LUMPECTOMY; Right ?    Comment:  papilloma ?No date: carpel tunnel surgery; Left ?No date: CESAREAN SECTION ?No date: chronic back pain unspecified ?    Comment:  severe multilevel spondylitic stenosis ?06/05/2017: COLONOSCOPY WITH PROPOFOL; N/A ?    Comment:  Procedure: COLONOSCOPY WITH PROPOFOL;  Surgeon: Mechele Collin, ?             Wilber Bihari, MD;  Location: [facility] ENDOSCOPY;  Service:  ?             Endoscopy;  Laterality: N/A; ?No date: KNEE SURGERY; Left ?    Comment:  cart. removed ?05/30/2012: LUMBAR  LAMINECTOMY/DECOMPRESSION MICRODISCECTOMY; Bilateral ?    Comment:  Procedure: LUMBAR LAMINECTOMY/DECOMPRESSION  ?             MICRODISCECTOMY 3 LEVELS;  Surgeon: Barnett Abu, MD;   ?             Location: [facility];  Service: Neurosurgery;   ?             Laterality: Bilateral;  Bilateral Lumbar  ?             two-three,three-four,four-five Laminectomy/Foraminotomy ?No date: MANDIBLE SURGERY ?No date: OOPHORECTOMY ?No date: TRIGGER FINGER RELEASE; Left ?    Comment:  thumb ? ? Reproductive/Obstetrics ?negative OB ROS ? ?  ? ? ? ? ? ? ? ? ? ? ? ? ? ?  ?  ? ? ? ? ? ? ? ?Anesthesia Physical ? ?Anesthesia Plan ? ?ASA: II ? ?Anesthesia Plan: Spinal and General/Spinal  ? ?Post-op Pain Management: Celebrex PO (pre-op)* and Gabapentin PO (pre-op)*  ? ?Induction: Intravenous ? ?PONV Risk Score and Plan: 4 or greater and Ondansetron, Dexamethasone, Midazolam and Aprepitant ? ?Airway Management Planned: Natural Airway and Simple Face Mask ? ?Additional Equipment:  ? ?Intra-op Plan:  ? ?Post-operative Plan:  ? ?Informed Consent: I have reviewed the patients History and Physical, chart, labs and discussed the procedure including the risks, benefits and alternatives for the proposed anesthesia with the patient or authorized representative who has indicated his/her understanding and acceptance.  ? ? ? ?Dental advisory given ? ?Plan Discussed  with: CRNA and Anesthesiologist ? ?Anesthesia Plan Comments:   ? ? ? ? ?Anesthesia Quick Evaluation ? ?

## 2021-06-14 NOTE — Anesthesia Procedure Notes (Signed)
Spinal ? ?Patient location during procedure: OR ?Start time: 06/14/2021 12:05 PM ?End time: 06/14/2021 12:12 PM ?Reason for block: surgical anesthesia ?Staffing ?Performed: other anesthesia staff  ?Resident/CRNA: Katherine Basset, CRNA ?Other anesthesia staff: Kathryne Hitch, RN ?Preanesthetic Checklist ?Completed: patient identified, IV checked, site marked, risks and benefits discussed, surgical consent, monitors and equipment checked, pre-op evaluation and timeout performed ?Spinal Block ?Patient position: sitting ?Prep: DuraPrep ?Patient monitoring: heart rate, cardiac monitor, continuous pulse ox and blood pressure ?Approach: midline ?Location: L2-3 ?Injection technique: single-shot ?Needle ?Needle type: Sprotte  ?Needle gauge: 24 G ?Needle length: 9 cm ?Assessment ?Sensory level: T6 ?Events: CSF return ?Additional Notes ?Placed by Kathryne Hitch, SRNA ? ? ? ?

## 2021-06-15 ENCOUNTER — Encounter: Payer: Self-pay | Admitting: Orthopedic Surgery

## 2021-06-15 DIAGNOSIS — M1612 Unilateral primary osteoarthritis, left hip: Secondary | ICD-10-CM | POA: Diagnosis not present

## 2021-06-15 MED ORDER — HYDROCODONE-ACETAMINOPHEN 5-325 MG PO TABS: 1.0000 | ORAL_TABLET | ORAL | 0 refills | Status: AC | PRN

## 2021-06-15 MED ORDER — METOCLOPRAMIDE HCL 10 MG PO TABS
ORAL_TABLET | ORAL | Status: AC
  Filled 2021-06-15: qty 1

## 2021-06-15 MED ORDER — SENNOSIDES-DOCUSATE SODIUM 8.6-50 MG PO TABS
ORAL_TABLET | ORAL | Status: AC
  Administered 2021-06-15: 1
  Filled 2021-06-15: qty 1

## 2021-06-15 MED ORDER — METOCLOPRAMIDE HCL 10 MG PO TABS
ORAL_TABLET | ORAL | Status: AC
  Administered 2021-06-15: 10 mg via ORAL
  Filled 2021-06-15: qty 1

## 2021-06-15 MED ORDER — ENOXAPARIN SODIUM 30 MG/0.3ML IJ SOSY
PREFILLED_SYRINGE | INTRAMUSCULAR | Status: AC
Start: 2021-06-15 — End: 2021-06-15
  Filled 2021-06-15: qty 0.3

## 2021-06-15 MED ORDER — FERROUS SULFATE 325 (65 FE) MG PO TABS
ORAL_TABLET | ORAL | Status: AC
  Administered 2021-06-15: 325 mg via ORAL
  Filled 2021-06-15: qty 1

## 2021-06-15 MED ORDER — CELECOXIB 200 MG PO CAPS
ORAL_CAPSULE | ORAL | Status: AC
  Filled 2021-06-15: qty 1

## 2021-06-15 MED ORDER — PANTOPRAZOLE SODIUM 40 MG PO TBEC
DELAYED_RELEASE_TABLET | ORAL | Status: AC
  Filled 2021-06-15: qty 1

## 2021-06-15 MED ORDER — CEFAZOLIN SODIUM-DEXTROSE 2-4 GM/100ML-% IV SOLN
INTRAVENOUS | Status: AC
  Administered 2021-06-15: 2 g via INTRAVENOUS
  Filled 2021-06-15: qty 100

## 2021-06-15 MED ORDER — ENOXAPARIN SODIUM 40 MG/0.4ML IJ SOSY: 40.0000 mg | PREFILLED_SYRINGE | INTRAMUSCULAR | 0 refills | Status: AC

## 2021-06-15 MED ORDER — ACETAMINOPHEN 10 MG/ML IV SOLN
INTRAVENOUS | Status: AC
  Filled 2021-06-15: qty 100

## 2021-06-15 MED ORDER — MAGNESIUM HYDROXIDE 400 MG/5ML PO SUSP
ORAL | Status: AC
  Filled 2021-06-15: qty 30

## 2021-06-15 MED ORDER — TRAMADOL HCL 50 MG PO TABS: 50.0000 mg | ORAL_TABLET | ORAL | 0 refills | Status: AC | PRN

## 2021-06-15 MED ORDER — ACETAMINOPHEN 10 MG/ML IV SOLN
INTRAVENOUS | Status: AC
Start: 2021-06-15 — End: 2021-06-15
  Filled 2021-06-15: qty 100

## 2021-06-15 MED ORDER — CELECOXIB 200 MG PO CAPS: 200.0000 mg | ORAL_CAPSULE | Freq: Two times a day (BID) | ORAL | 0 refills | Status: AC

## 2021-06-15 MED ORDER — ONDANSETRON HCL 4 MG/2ML IJ SOLN
INTRAMUSCULAR | Status: AC
Start: 2021-06-15 — End: 2021-06-15
  Administered 2021-06-15: 4 mg via INTRAVENOUS
  Filled 2021-06-15: qty 2

## 2021-06-15 MED ORDER — OXYCODONE HCL 5 MG PO TABS
ORAL_TABLET | ORAL | Status: AC
  Filled 2021-06-15: qty 2

## 2021-06-15 NOTE — Progress Notes (Signed)
?  Subjective: ?1 Day Post-Op Procedure(s) (LRB): ?TOTAL HIP ARTHROPLASTY (Left) ?Patient reports pain as mild.  Son at bedside. ?Patient is well, and has had no acute complaints or problems ?Plan is to go Home after hospital stay. ?Negative for chest pain and shortness of breath ?Fever: no ?Gastrointestinal: negative for nausea and vomiting.  Patient has not had a bowel movement. ? ?Objective: ?Vital signs in last 24 hours: ?Temp:  [97 ?F (36.1 ?C)-98.6 ?F (37 ?C)] 97.4 ?F (36.3 ?C) (03/30 0730) ?Pulse Rate:  [55-71] 64 (03/30 0730) ?Resp:  [11-18] 18 (03/30 0730) ?BP: (93-159)/(44-72) 116/53 (03/30 0730) ?SpO2:  [93 %-98 %] 94 % (03/30 0730) ?Weight:  [93.5 kg] 93.5 kg (03/30 0335) ? ?Intake/Output from previous day: ? ?Intake/Output Summary (Last 24 hours) at 06/15/2021 0823 ?Last data filed at 06/15/2021 0998 ?Gross per 24 hour  ?Intake 3216.45 ml  ?Output 1170 ml  ?Net 2046.45 ml  ?  ?Intake/Output this shift: ?No intake/output data recorded. ? ?Labs: ?Recent Labs  ?  06/12/21 ?1204  ?HGB 15.3*  ? ?Recent Labs  ?  06/12/21 ?1204  ?WBC 5.5  ?RBC 4.98  ?HCT 45.0  ?PLT 179  ? ?Recent Labs  ?  06/12/21 ?1204  ?NA 138  ?K 3.8  ?CL 99  ?CO2 29  ?BUN 12  ?CREATININE 0.69  ?GLUCOSE 87  ?CALCIUM 9.7  ? ?No results for input(s): LABPT, INR in the last 72 hours. ? ? ?EXAM ?General - Patient is Alert, Appropriate, and Oriented ?Extremity - Neurovascular intact ?Dorsiflexion/Plantar flexion intact ?Compartment soft ?Dressing/Incision -clean, dry, no drainage, Hemovac in place.  ?Motor Function - intact, moving foot and toes well on exam. Able to perform independent SLR.  ?Cardiovascular- Regular rate and rhythm, no murmurs/rubs/gallops ?Respiratory- Lungs clear to auscultation bilaterally ?Gastrointestinal- soft, nontender, and active bowel sounds ? ? ?Assessment/Plan: ?1 Day Post-Op Procedure(s) (LRB): ?TOTAL HIP ARTHROPLASTY (Left) ?Principal Problem: ?  Hx of total hip arthroplasty, left ? ?Estimated body mass index is  33.27 kg/m? as calculated from the following: ?  Height as of this encounter: 5\' 6"  (1.676 m). ?  Weight as of this encounter: 93.5 kg. ?Advance diet ?Up with therapy ? ?Anticipate d/c this PM. Will have RN pull drain prior to d/c. ? ? ? ?DVT Prophylaxis - Lovenox, Ted hose, and SCDs ?Weight-Bearing as tolerated to left leg ? ? , PA-C ?[facility] ?06/15/2021, 8:23 AM ? ?

## 2021-06-15 NOTE — Care Management Obs Status (Signed)
MEDICARE OBSERVATION STATUS NOTIFICATION ? ? ?Patient Details  ?Name: Tina Petty ?MRN: [id number] ?Date of Birth: Apr 24, 1950 ? ? ?Medicare Observation Status Notification Given:    ?Verbally reviewed ? ? ?Marlowe Sax, RN ?06/15/2021, 11:06 AM ?

## 2021-06-15 NOTE — Discharge Summary (Signed)
?Physician Discharge Summary  ?Patient ID: ?Tina Petty ?MRN: [id number] ?DOB/AGE: 11-14-1950 71 y.o. ? ?Admit date: 06/14/2021 ?Discharge date: 06/15/2021 ? ?Admission Diagnoses:  ?Hx of total hip arthroplasty, left [Z96.642] ? ?Surgeries:Procedure(s): ? Left total hip arthroplasty ?  ?SURGEON:  Jena Gauss. M.D. ?  ?ASSISTANT: Baldwin Jamaica, PA-C (present and scrubbed throughout the case, critical for assistance with exposure, retraction, instrumentation, and closure) ?  ?ANESTHESIA: spinal ?  ?ESTIMATED BLOOD LOSS: 300 mL ?  ?FLUIDS REPLACED: 1000 mL of crystalloid ?  ?DRAINS: 2 medium Hemovac drains ?  ?IMPLANTS UTILIZED: DePuy 15 mm small stature AML femoral stem, 52 mm OD Pinnacle 100 acetabular component, neutral Pinnacle Altrx polyethylene insert, and a 36 mm M-SPEC +1.5 mm hip ball ? ?Discharge Diagnoses: ?Patient Active Problem List  ? Diagnosis Date Noted  ? Hx of total hip arthroplasty, left 06/14/2021  ? Total knee replacement status 03/16/2020  ? Anxiety 03/15/2020  ? Depression 03/15/2020  ? Hyperlipidemia 03/15/2020  ? Hypertension 03/15/2020  ? Primary osteoarthritis of left knee 06/26/2016  ? 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 ? ?Past Medical History:  ?Diagnosis Date  ? Anxiety   ? Arthritis   ? Depression   ? GERD (gastroesophageal reflux disease)   ? occ  ? Hx of dizziness   ? Hyperlipidemia   ? Hypertension   ? PONV (postoperative nausea and vomiting)   ? not recently  ? Spinal stenosis of lumbar region with neurogenic claudication   ? ?  ?Transfusion:  ?  ?Consultants (if any):  ? ?Discharged Condition: Improved ? ?Hospital Course: Tina Petty is an 71 y.o. female who was admitted 06/14/2021 with a diagnosis of left hip osteoarthritis and went to the operating room on 06/14/2021 and underwent left total hip arthroplasty through posterior approach. The patient received perioperative antibiotics for prophylaxis (see below). The  patient tolerated the procedure well and was transported to PACU in stable condition. After meeting PACU criteria, the patient was subsequently transferred to the Orthopaedics/Rehabilitation unit.  ? ?The patient received DVT prophylaxis in the form of early mobilization, Lovenox, TED hose, and SCDs . A sacral pad had been placed and heels were elevated off of the bed with rolled towels in order to protect skin integrity. Foley catheter was discontinued on postoperative day #0. Wound drains were discontinued on postoperative day #1. The surgical incision was healing well without signs of infection. ? ?Physical therapy was initiated postoperatively for transfers, gait training, and strengthening. Occupational therapy was initiated for activities of daily living and evaluation for assisted devices. Rehabilitation goals were reviewed in detail with the patient. The patient made steady progress with physical therapy and physical therapy recommended discharge to Home.  ? ?The patient achieved the preliminary goals of this hospitalization and was felt to be medically and orthopaedically appropriate for discharge. ? ?She was given perioperative antibiotics:  ?Anti-infectives (From admission, onward)  ? ? Start     Dose/Rate Route Frequency Ordered Stop  ? 06/14/21 2200  ceFAZolin (ANCEF) IVPB 2g/100 mL premix       ? 2 g ?200 mL/hr over 30 Minutes Intravenous Every 6 hours 06/14/21 1836 06/15/21 0517  ? 06/14/21 0958  ceFAZolin (ANCEF) 2-4 GM/100ML-% IVPB       ?Note to Pharmacy: Mikey Bussing P: cabinet override  ?    06/14/21 0958 06/14/21 2311  ? 06/14/21 0600  ceFAZolin (ANCEF) IVPB 2g/100 mL premix       ?  2 g ?200 mL/hr over 30 Minutes Intravenous On call to O.R. 06/13/21 2155 06/15/21 0015  ? ?  ?. ? ?Recent vital signs:  ?Vitals:  ? 06/15/21 0730 06/15/21 1627  ?BP: (!) 116/53 (!) 127/58  ?Pulse: 64 66  ?Resp: 18 14  ?Temp: (!) 97.4 ?F (36.3 ?C) (!) 97.4 ?F (36.3 ?C)  ?SpO2: 94% 95%  ? ? ?Recent laboratory studies:   ?No results for input(s): WBC, HGB, HCT, PLT, K, CL, CO2, BUN, CREATININE, GLUCOSE, CALCIUM, LABPT, INR in the last 72 hours. ? ? ?Diagnostic Studies: DG Hip Port Unilat With Pelvis 1V Left ? ?Result Date: 06/14/2021 ?CLINICAL DATA:  Postoperative left hip surgery. EXAM: DG HIP (WITH OR WITHOUT PELVIS) 1V PORT LEFT COMPARISON:  None. FINDINGS: Status post total left hip arthroplasty. No perihardware lucency is seen to indicate hardware failure or loosening. Expected postoperative changes including subcutaneous air soft tissue swelling, and left hip drain. Moderate joint space narrowing and peripheral osteophytosis degenerative changes of the pubic symphysis and right femoroacetabular joint. IMPRESSION: Status post total left hip arthroplasty without evidence of hardware failure. Electronically Signed   By: Neita Garnet M.D.   On: 06/14/2021 17:13   ? ?Discharge Medications:   ?Allergies as of 06/15/2021   ? ?   Reactions  ? Diclofenac Other (See Comments)  ? Topical product cause vertigo  ? Pravastatin   ? Muscle pain  ? ?  ? ?  ?Medication List  ?  ? ?STOP taking these medications   ? ?aspirin 81 MG EC tablet ?  ? ?  ? ?TAKE these medications   ? ?amLODipine-benazepril 5-20 MG capsule ?Commonly known as: LOTREL ?Take 1 capsule by mouth every morning. ?  ?amoxicillin 500 MG tablet ?Commonly known as: AMOXIL ?Take 2,000 mg by mouth as needed. Prior to dental appointments-Take 4 tablets 1 hour prior to dental procedures ?  ?Calcium Carb-Cholecalciferol 600-800 MG-UNIT Tabs ?Take 1 tablet by mouth daily. ?  ?calcium carbonate 500 MG chewable tablet ?Commonly known as: TUMS - dosed in mg elemental calcium ?Chew 1 tablet by mouth as needed for indigestion or heartburn. ?  ?celecoxib 200 MG capsule ?Commonly known as: CELEBREX ?Take 1 capsule (200 mg total) by mouth 2 (two) times daily. ?What changed:  ?medication strength ?how much to take ?when to take this ?additional instructions ?  ?cholecalciferol 25 MCG (1000 UNIT)  tablet ?Commonly known as: VITAMIN D3 ?Take 1,000 Units by mouth daily. ?  ?enoxaparin 40 MG/0.4ML injection ?Commonly known as: LOVENOX ?Inject 0.4 mLs (40 mg total) into the skin daily for 14 days. ?  ?fish oil-omega-3 fatty acids 1000 MG capsule ?Take 1 g by mouth daily. ?  ?FLUoxetine 20 MG capsule ?Commonly known as: PROZAC ?Take 20 mg by mouth every morning. ?  ?HYDROcodone-acetaminophen 5-325 MG tablet ?Commonly known as: NORCO/VICODIN ?Take 1 tablet by mouth every 4 (four) hours as needed for severe pain. ?  ?multivitamin with minerals tablet ?Take 1 tablet by mouth daily. Centrum Silver 50+ ?  ?HAIR SKIN AND NAILS FORMULA PO ?Take 2 tablets by mouth daily. ?  ?rosuvastatin 5 MG tablet ?Commonly known as: CRESTOR ?Take 5 mg by mouth daily with supper. ?  ?traMADol 50 MG tablet ?Commonly known as: ULTRAM ?Take 1 tablet (50 mg total) by mouth every 4 (four) hours as needed for moderate pain. ?  ? ?  ? ?  ?  ? ? ?  ?Durable Medical Equipment  ?(From admission, onward)  ?  ? ? ?  ? ?  Start     Ordered  ? 06/14/21 1837  DME Walker rolling  Once       ?Question:  Patient needs a walker to treat with the following condition  Answer:  S/P total hip arthroplasty  ? 06/14/21 1836  ? 06/14/21 1837  DME Bedside commode  Once       ?Question:  Patient needs a bedside commode to treat with the following condition  Answer:  S/P total hip arthroplasty  ? 06/14/21 1836  ? ?  ?  ? ?  ? ? ?Disposition: Home with home health PT ? ? ? ? Follow-up Information   ? ? Donato HeinzHooten, James P, MD Follow up on 07/27/2021.   ?Specialty: Orthopedic Surgery ?Why: at 1:45pm ?Contact information: ?[address] ?[facility] [place] ?[place] [address] ?[phone] ? ? ?  ?  ? ?  ?  ? ?  ? ? ? ?Lasandra BeechBENJAMIN Kellye Mizner, PA-C ?06/15/2021, 5:20 PM ? ? ? ? ? ?

## 2021-06-15 NOTE — TOC Progression Note (Signed)
Transition of Care (TOC) - Progression Note  ? ? ?Patient Details  ?Name: Tina Petty ?MRN: [id number] ?Date of Birth: 1950/11/01 ? ?Transition of Care (TOC) CM/SW Contact  ?Marlowe Sax, RN ?Phone Number: ?06/15/2021, 10:37 AM ? ?Clinical Narrative:   The patient is set up with [facility] for [facility] services prior to surgery by the Surgeons office ? ? ? ?  ?  ? ?Expected Discharge Plan and Services ?  ?  ?  ?  ?  ?                ?  ?  ?  ?  ?  ?  ?  ?  ?  ?  ? ? ?Social Determinants of Health (SDOH) Interventions ?  ? ?Readmission Risk Interventions ?   ? View : No data to display.  ?  ?  ?  ? ? ?

## 2021-06-15 NOTE — Evaluation (Signed)
Physical Therapy Evaluation ?Patient Details ?Name: Tina Petty ?MRN: [id number] ?DOB: [birth year] ?Today's Date: 06/15/2021 ? ?History of Present Illness ? 71 y/o female s/p L total hip (posterior approach) 06/14/21.  ?Clinical Impression ? Pt eager to work with PT and did relatively well by all typical PT metrics, but struggled with lighthededness, low grade nausea and low BP t/o much of the session.  She was initially hesitant to try a lot of walking, but did not have any passing out sensation, etc with the effort and ultimately walked ~75 ft with quality, consistent cadence and minimal UE reliance on the walker.  Pt showed great effort and expect she should do well with prolonged walk and stairs this afternoon if other symptoms improve.   ?   ? ?Recommendations for follow up therapy are one component of a multi-disciplinary discharge planning process, led by the attending physician.  Recommendations may be updated based on patient status, additional functional criteria and insurance authorization. ? ?Follow Up Recommendations Follow physician's recommendations for discharge plan and follow up therapies ? ?  ?Assistance Recommended at Discharge Intermittent Supervision/Assistance  ?Patient can return home with the following ? Assist for transportation;Assistance with cooking/housework ? ?  ?Equipment Recommendations Rolling walker (2 wheels);BSC/3in1  ?Recommendations for Other Services ?    ?  ?Functional Status Assessment Patient has had a recent decline in their functional status and demonstrates the ability to make significant improvements in function in a reasonable and predictable amount of time.  ? ?  ?Precautions / Restrictions Precautions ?Precautions: Fall;Posterior Hip ?Precaution Booklet Issued: Yes (comment) ?Restrictions ?Weight Bearing Restrictions: No  ? ?  ? ?Mobility ? Bed Mobility ?  ?  ?  ?  ?  ?  ?  ?General bed mobility comments: seated on arrival, not tested ?  ? ?Transfers ?Overall  transfer level: Needs assistance ?Equipment used: Rolling walker (2 wheels) ?Transfers: Sit to/from Stand ?Sit to Stand: Min guard ?  ?  ?  ?  ?  ?General transfer comment: cuing for set up and sequencing, able to rise w/o direct assist on multiple attempts this session ?  ? ?Ambulation/Gait ?Ambulation/Gait assistance: Min guard ?Gait Distance (Feet): 75 Feet ?Assistive device: Rolling walker (2 wheels) ?  ?  ?  ?  ?General Gait Details: Pt initially with some hesitancy and feelings of nausea but was able to compose herself and ultimately walked ~75 ft with consistent cadence, minimal walker reliance.  No overt LOBs or safety issues apart from some lightheadedness ? ?Stairs ?  ?  ?  ?  ?  ? ?Wheelchair Mobility ?  ? ?Modified Rankin (Stroke Patients Only) ?  ? ?  ? ?Balance Overall balance assessment: Modified Independent ?  ?  ?  ?  ?  ?  ?  ?  ?  ?  ?  ?  ?  ?  ?  ?  ?  ?  ?   ? ? ? ?Pertinent Vitals/Pain Pain Assessment ?Pain Assessment: 0-10 ?Pain Score: 2  ?Pain Location: L hip incision  ? ? ?Home Living Family/patient expects to be discharged to:: Private residence ?Living Arrangements: Alone ?Available Help at Discharge: Family;Available PRN/intermittently;Friend(s) (son lives very close and plans on checking in QD) ?  ?Home Access: Level entry ?  ?  ?  ?Home Layout: One level ?  ?   ?  ?Prior Function Prior Level of Function : Independent/Modified Independent ?  ?  ?  ?  ?  ?  ?Mobility  Comments: Pt typically independent, driving, staying active ?  ?  ? ? ?Hand Dominance  ?   ? ?  ?Extremity/Trunk Assessment  ? Upper Extremity Assessment ?Upper Extremity Assessment: Overall WFL for tasks assessed ?  ? ?Lower Extremity Assessment ?Lower Extremity Assessment: Overall WFL for tasks assessed (expexted L LE post-op weakness) ?  ? ?   ?Communication  ? Communication: No difficulties  ?Cognition Arousal/Alertness: Awake/alert ?Behavior During Therapy: [facility] for tasks assessed/performed ?Overall Cognitive Status:  Within Functional Limits for tasks assessed ?  ?  ?  ?  ?  ?  ?  ?  ?  ?  ?  ?  ?  ?  ?  ?  ?  ?  ?  ? ?  ?General Comments General comments (skin integrity, edema, etc.): Pt with a little nausea and clamy sweating (feels likely oxy/pain med related). She did have low BP with difficulty getting consistent dynamap readings: seated pre standing 107/56, seated post standing 89/77 ? ?  ?Exercises Total Joint Exercises ?Ankle Circles/Pumps: AROM, 10 reps ?Gluteal Sets: AROM, 5 reps ?Hip ABduction/ADduction: AROM, Strengthening, 10 reps ?Long Arc Quad: Strengthening, 10 reps ?Knee Flexion: Strengthening, 10 reps ?Marching in Standing: Strengthening, 10 reps  ? ?Assessment/Plan  ?  ?PT Assessment Patient needs continued PT services  ?PT Problem List Decreased strength;Decreased range of motion;Decreased activity tolerance;Decreased balance;Decreased mobility;Decreased knowledge of use of DME;Decreased safety awareness;Pain ? ?   ?  ?PT Treatment Interventions DME instruction;Gait training;Stair training;Functional mobility training;Therapeutic activities;Therapeutic exercise;Balance training;Neuromuscular re-education;Patient/family education   ? ?PT Goals (Current goals can be found in the Care Plan section)  ?Acute Rehab PT Goals ?Patient Stated Goal: go home today if possible ?PT Goal Formulation: With patient ?Time For Goal Achievement: 06/29/21 ?Potential to Achieve Goals: Good ? ?  ?Frequency BID ?  ? ? ?Co-evaluation   ?  ?  ?  ?  ? ? ?  ?AM-PAC PT "6 Clicks" Mobility  ?Outcome Measure Help needed turning from your back to your side while in a flat bed without using bedrails?: A Little ?Help needed moving from lying on your back to sitting on the side of a flat bed without using bedrails?: A Little ?Help needed moving to and from a bed to a chair (including a wheelchair)?: A Little ?Help needed standing up from a chair using your arms (e.g., wheelchair or bedside chair)?: A Little ?Help needed to walk in hospital  room?: A Little ?Help needed climbing 3-5 steps with a railing? : A Little ?6 Click Score: 18 ? ?  ?End of Session Equipment Utilized During Treatment: Gait belt ?Activity Tolerance: Patient tolerated treatment well;Other (comment) (nausea) ?Patient left: in chair;with call bell/phone within reach;with family/visitor present ?Nurse Communication: Mobility status ?PT Visit Diagnosis: Muscle weakness (generalized) (M62.81);Difficulty in walking, not elsewhere classified (R26.2) ?  ? ?Time: [id number] ?PT Time Calculation (min) (ACUTE ONLY): 29 min ? ? ?Charges:   PT Evaluation ?$PT Eval Low Complexity: 1 Low ?PT Treatments ?$Gait Training: 8-22 mins ?$Therapeutic Exercise: 8-22 mins ?  ?   ? ? ?Kreg Shropshire, DPT ?06/15/2021, 12:31 PM ? ?

## 2021-06-15 NOTE — Progress Notes (Signed)
Physical Therapy Treatment ?Patient Details ?Name: Tina Petty ?MRN: [id number] ?DOB: [birth year] ?Today's Date: 06/15/2021 ? ? ?History of Present Illness 71 y/o female s/p L total hip (posterior approach) 06/14/21. ? ?  ?PT Comments  ? ? Significant improvement from limited session this morning; pt with no nausea, lightheadness, sweating, etc.  She was able to easily and confidently ambulate >200 ft and negotiate steps, she showed great strength with LE exercises and is having hardly any pain at all. Pt moving well and progressing nicely.     ?Recommendations for follow up therapy are one component of a multi-disciplinary discharge planning process, led by the attending physician.  Recommendations may be updated based on patient status, additional functional criteria and insurance authorization. ? ?Follow Up Recommendations ? Follow physician's recommendations for discharge plan and follow up therapies ?  ?  ?Assistance Recommended at Discharge Intermittent Supervision/Assistance  ?Patient can return home with the following Assist for transportation;Assistance with cooking/housework;A little help with bathing/dressing/bathroom ?  ?Equipment Recommendations ? Rolling walker (2 wheels);BSC/3in1  ?  ?Recommendations for Other Services   ? ? ?  ?Precautions / Restrictions Precautions ?Precautions: Fall;Posterior Hip ?Precaution Booklet Issued: Yes (comment) ?Restrictions ?Weight Bearing Restrictions: Yes ?LLE Weight Bearing: Weight bearing as tolerated ?Other Position/Activity Restrictions: WBAT to LLE  ?  ? ?Mobility ? Bed Mobility ?Overal bed mobility: Needs Assistance ?  ?  ?  ?  ?  ?  ?General bed mobility comments: In recliner pre and post session ?  ? ?Transfers ?Overall transfer level: Needs assistance ?Equipment used: Rolling walker (2 wheels) ?Transfers: Sit to/from Stand ?Sit to Stand: Min guard ?  ?  ?  ?  ?  ?General transfer comment: easily able to rise to standing and control descent ?   ? ?Ambulation/Gait ?Ambulation/Gait assistance: Min guard ?Gait Distance (Feet): 200 Feet ?Assistive device: Rolling walker (2 wheels) ?  ?  ?  ?  ?General Gait Details: Pt easily able to assume consistent cadence and showed increased confidence and consistent speed with increased distance.  Vitals remain appropriate t/o, no LOBs or nausea. ? ? ?Stairs ?Stairs: Yes ?Stairs assistance: Supervision ?Stair Management: Two rails, Step to pattern ?Number of Stairs: 4 ?General stair comments: Pt easily and confidently able to negotiate up/down steps w/o assist and only minimal explanation ? ? ?Wheelchair Mobility ?  ? ?Modified Rankin (Stroke Patients Only) ?  ? ? ?  ?Balance Overall balance assessment: Modified Independent ?  ?  ?  ?  ?  ?  ?  ?  ?  ?  ?  ?  ?  ?  ?  ?  ?  ?  ?  ? ?  ?Cognition Arousal/Alertness: Awake/alert ?Behavior During Therapy: [facility] for tasks assessed/performed ?Overall Cognitive Status: Within Functional Limits for tasks assessed ?  ?  ?  ?  ?  ?  ?  ?  ?  ?  ?  ?  ?  ?  ?  ?  ?  ?  ?  ? ?  ?Exercises Total Joint Exercises ?Ankle Circles/Pumps: AROM, 10 reps ?Quad Sets: Strengthening, 10 reps ?Gluteal Sets: AROM, 5 reps ?Hip ABduction/ADduction: Strengthening, 10 reps ?Long Arc Quad: Strengthening, 10 reps ?Knee Flexion: Strengthening, 10 reps ?Marching in Standing: Strengthening, 10 reps, Seated ? ?  ?General Comments General comments (skin integrity, edema, etc.): Pt with no nausea, lightheadedness, claminess, etc this afternoon. ?  ?  ? ?Pertinent Vitals/Pain Pain Assessment ?Pain Assessment: 0-10 ?Pain Score: 1  ?Pain  Location: L hip incision  ? ? ?Home Living Family/patient expects to be discharged to:: Private residence ?Living Arrangements: Alone ?Available Help at Discharge: Family;Available PRN/intermittently;Friend(s) ?Type of Home: House ?Home Access: Level entry ?  ?  ?  ?Home Layout: One level ?Home Equipment: [place] Mor - single point ?Additional Comments: son and DIL live behind pt,  neighbors also available to assist as needed  ?  ?Prior Function    ?  ?  ?   ? ?PT Goals (current goals can now be found in the care plan section) Progress towards PT goals: Progressing toward goals ? ?  ?Frequency ? ? ? BID ? ? ? ?  ?PT Plan Current plan remains appropriate  ? ? ?Co-evaluation   ?  ?  ?  ?  ? ?  ?AM-PAC PT "6 Clicks" Mobility   ?Outcome Measure ? Help needed turning from your back to your side while in a flat bed without using bedrails?: None ?Help needed moving from lying on your back to sitting on the side of a flat bed without using bedrails?: None ?Help needed moving to and from a bed to a chair (including a wheelchair)?: None ?Help needed standing up from a chair using your arms (e.g., wheelchair or bedside chair)?: None ?Help needed to walk in hospital room?: None ?Help needed climbing 3-5 steps with a railing? : A Little ?6 Click Score: 23 ? ?  ?End of Session Equipment Utilized During Treatment: Gait belt ?Activity Tolerance: Patient tolerated treatment well;Other (comment) ?Patient left: in chair;with call bell/phone within reach ?Nurse Communication: Mobility status ?PT Visit Diagnosis: Muscle weakness (generalized) (M62.81);Difficulty in walking, not elsewhere classified (R26.2) ?  ? ? ?Time: 3383-2919 ?PT Time Calculation (min) (ACUTE ONLY): 27 min ? ?Charges:  $Gait Training: 8-22 mins ?$Therapeutic Exercise: 8-22 mins          ?          ? ?Malachi Pro, DPT ?06/15/2021, 4:41 PM ? ?

## 2021-06-15 NOTE — TOC Progression Note (Signed)
Transition of Care (TOC) - Progression Note  ? ? ?Patient Details  ?Name: Tina Petty ?MRN: [id number] ?Date of Birth: 03-Feb-1951 ? ?Transition of Care (TOC) CM/SW Contact  ?Conception Oms, RN ?Phone Number: ?06/15/2021, 11:01 AM ? ?Clinical Narrative:   Met with the patient in the room at the bedside ?They currently has a raised toilet ?They need a rolling walker, [employer] will deliver to the bedside ?They have transportation with family ?They can afford their medication ?They are set up with [facility] for Home health services  ? ? ? ?  ?  ? ?Expected Discharge Plan and Services ?  ?  ?  ?  ?  ?                ?  ?  ?  ?  ?  ?  ?  ?  ?  ?  ? ? ?Social Determinants of Health (SDOH) Interventions ?  ? ?Readmission Risk Interventions ?   ? View : No data to display.  ?  ?  ?  ? ? ?

## 2021-06-15 NOTE — Evaluation (Signed)
Occupational Therapy Evaluation ?Patient Details ?Name: Tina Petty ?MRN: [id number] ?DOB: [birth year] ?Today's Date: 06/15/2021 ? ? ?History of Present Illness 71 y/o female s/p L total hip (posterior approach) 06/14/21.  ? ?Clinical Impression ?  ?Pt seen this day for OT evaluation s/p L THA, posterior approach.  Pt with posterior hip precautions and WBAT.  Pt presents with functional decline in ADLs and functional mobility post sx.  OT instructed pt in hip precautions related to LB ADLs.  Pt reports she has reacher at home to help with picking objects off the floor.  Made recommendation for transfer tub bench, which pt was receptive to.  Family/friends live nearby and will be able to provide pt with necessary assist during the day/night.  Pt had nausea and episode of feeling clammy sitting EOB, but was able to stand briefly with min guard and RW; returned to sitting d/t low BP, see note for details.  OT left pt sitting EOB with PT present beside pt, PT continuing to monitor VS.  Pt will benefit from additional skilled OT in the acute setting to reinforce ADL strategies, hip precautions, and AE/DME training.  Recommend [facility] OT upon discharge.  ?   ? ?Recommendations for follow up therapy are one component of a multi-disciplinary discharge planning process, led by the attending physician.  Recommendations may be updated based on patient status, additional functional criteria and insurance authorization.  ? ?Follow Up Recommendations ? Home health OT  ?  ?Assistance Recommended at Discharge Intermittent Supervision/Assistance  ?Patient can return home with the following A little help with walking and/or transfers;A little help with bathing/dressing/bathroom;Assistance with cooking/housework;Assist for transportation;Help with stairs or ramp for entrance ? ?  ?Functional Status Assessment ? Patient has had a recent decline in their functional status and demonstrates the ability to make significant improvements in  function in a reasonable and predictable amount of time.  ?Equipment Recommendations ? Tub/shower bench  ?  ?Recommendations for Other Services   ? ? ?  ?Precautions / Restrictions Precautions ?Precautions: Fall;Posterior Hip ?Precaution Booklet Issued: Yes (comment) ?Restrictions ?Weight Bearing Restrictions: No ?Other Position/Activity Restrictions: WBAT to LLE  ? ?  ? ?Mobility Bed Mobility ?Overal bed mobility: Needs Assistance ?Bed Mobility: Supine to Sit ?  ?  ?Supine to sit: HOB elevated, Min assist ?  ?  ?General bed mobility comments: assist to slide LLE off edge of bed ?Patient Response: Cooperative ? ?Transfers ?Overall transfer level: Needs assistance ?Equipment used: Rolling walker (2 wheels) ?Transfers: Sit to/from Stand ?Sit to Stand: Min guard ?  ?  ?  ?  ?  ?General transfer comment: cuing for set up and sequencing, able to rise w/o direct assist ?  ? ?  ?Balance Overall balance assessment: Modified Independent ?  ?  ?  ?  ?  ?  ?  ?  ?  ?  ?  ?  ?  ?  ?  ?  ?  ?  ?   ? ?ADL either performed or assessed with clinical judgement  ? ?ADL Overall ADL's : Needs assistance/impaired ?  ?  ?  ?  ?  ?  ?  ?  ?  ?  ?Lower Body Dressing: Maximal assistance ?Lower Body Dressing Details (indicate cue type and reason): pt has reacher; would benefit from information to obtain hip kit, or at least long handled sponge and sockaid ?  ?  ?  ?  ?  ?  ?Functional mobility during ADLs: Min guard;Rolling walker (  2 wheels) ?General ADL Comments: sit to stand from bedside; no amb d/t low BP in standing  ? ? ? ?Vision Patient Visual Report: No change from baseline ?   ?   ?   ?  ?   ?  ? ?Pertinent Vitals/Pain Pain Assessment ?Pain Assessment: 0-10 ?Pain Score: 2  ?Pain Location: L hip incision ?Pain Intervention(s): Limited activity within patient's tolerance, Monitored during session, Repositioned, Premedicated before session  ? ? ? ?Hand Dominance Right ?  ?Extremity/Trunk Assessment Upper Extremity Assessment ?Upper  Extremity Assessment: Overall WFL for tasks assessed ?  ?Lower Extremity Assessment ?Lower Extremity Assessment: Overall WFL for tasks assessed;LLE deficits/detail ?LLE Deficits / Details: post op weakness ?  ?  ?  ?Communication Communication ?Communication: No difficulties ?  ?Cognition Arousal/Alertness: Awake/alert ?Behavior During Therapy: [facility] for tasks assessed/performed ?Overall Cognitive Status: Within Functional Limits for tasks assessed ?  ?  ?  ?  ?  ?  ?  ?  ?  ?  ?  ?  ?  ?  ?  ?  ?General Comments: pleasant, cooperative ?  ?  ?General Comments  Pt with nausea and reported feeling clamy upon transition from supine to sitting EOB.  Sat several min to sip cold drink.  Pt reported feeling it was likely d/t pain medication she had just taken.  BP low at 107/56 while seated, seated post standing 89/77.  Provided pt with cold washcloth.  Left pt with PT at end of OT session, PT monitoring BP. ? ?  ?Exercises Other Exercises ?Other Exercises: Instructed pt in hip precautions relating to safe ADL completion; left pt with precautions handout. ?  ?    ? ? ?Home Living Family/patient expects to be discharged to:: Private residence ?Living Arrangements: Alone ?Available Help at Discharge: Family;Available PRN/intermittently;Friend(s) ?Type of Home: House ?Home Access: Level entry ?  ?  ?Home Layout: One level ?  ?  ?Bathroom Shower/Tub: Tub/shower unit ?  ?Bathroom Toilet: Handicapped height ?  ?  ?Home Equipment: Kasandra Knudsen - single point ?  ?Additional Comments: son and DIL live behind pt, neighbors also available to assist as needed ?  ? ?  ?Prior Functioning/Environment Prior Level of Function : Independent/Modified Independent ?  ?  ?  ?  ?  ?  ?Mobility Comments: Pt typically independent, driving, staying active ?ADLs Comments: indep to perform all ADLs prior to sx ?  ? ?  ?  ?OT Problem List: Decreased strength;Decreased activity tolerance;Decreased knowledge of use of DME or AE;Impaired balance (sitting and/or  standing);Decreased knowledge of precautions;Pain ?  ?   ?OT Treatment/Interventions: Self-care/ADL training;Balance training;Therapeutic exercise;DME and/or AE instruction;Patient/family education  ?  ?OT Goals(Current goals can be found in the care plan section) Acute Rehab OT Goals ?Patient Stated Goal: To go home with family/friend support ?OT Goal Formulation: With patient ?Time For Goal Achievement: 06/29/21 ?Potential to Achieve Goals: Good  ?OT Frequency: Min 2X/week ?  ? ?Co-evaluation   ?  ?  ?  ?  ? ?  ?AM-PAC OT "6 Clicks" Daily Activity     ?Outcome Measure Help from another person eating meals?: None ?Help from another person taking care of personal grooming?: None ?Help from another person toileting, which includes using toliet, bedpan, or urinal?: A Little ?Help from another person bathing (including washing, rinsing, drying)?: A Lot ?Help from another person to put on and taking off regular upper body clothing?: None ?Help from another person to put on and taking off regular  lower body clothing?: A Lot ?6 Click Score: 19 ?  ?End of Session Equipment Utilized During Treatment: Gait belt;Rolling walker (2 wheels) ?Nurse Communication: Mobility status ? ?Activity Tolerance: Other (comment) (limited by nausea and low BP) ?Patient left: in bed;Other (comment) (left pt sitting EOB with PT beside) ? ?OT Visit Diagnosis: Other abnormalities of gait and mobility (R26.89);Unsteadiness on feet (R26.81);Pain ?Pain - Right/Left: Left ?Pain - part of body: Hip  ?              ?Time: 0950-1005 ?OT Time Calculation (min): 15 min ?Charges:  OT General Charges ?$OT Visit: 1 Visit ?OT Evaluation ?$OT Eval Moderate Complexity: 1 Mod ? ?Leta Speller, MS, OTR/L ? ? ?Darleene Cleaver ?06/15/2021, 2:57 PM ?

## 2021-06-16 LAB — SURGICAL PATHOLOGY

## 2021-06-22 ENCOUNTER — Other Ambulatory Visit: Payer: Self-pay

## 2021-06-22 DIAGNOSIS — Z1211 Encounter for screening for malignant neoplasm of colon: Secondary | ICD-10-CM

## 2021-06-22 DIAGNOSIS — Z800 Family history of malignant neoplasm of digestive organs: Secondary | ICD-10-CM

## 2021-06-22 MED ORDER — NA SULFATE-K SULFATE-MG SULF 17.5-3.13-1.6 GM/177ML PO SOLN: 1.0000 | Freq: Once | ORAL | 0 refills | Status: AC

## 2021-06-22 NOTE — Progress Notes (Signed)
Gastroenterology Pre-Procedure Review ? ?Request Date: 03/06/22 ?Requesting Physician: Dr. Servando Snare ? ?PATIENT REVIEW QUESTIONS: The patient responded to the following health history questions as indicated:   ? ?1. Are you having any GI issues? no ?2. Do you have a personal history of Polyps? no ?3. Do you have a family history of Colon Cancer or Polyps? yes (sister 60 deceased colorectal CA, and father deceased colon cancer) ?4. Diabetes Mellitus? no ?5. Joint replacements in the past 12 months?yes (March 2023 Left Hip Replacement) ?6. Major health problems in the past 3 months?yes (Left Hip Replacement March 2023) ?7. Any artificial heart valves, MVP, or defibrillator?no ?   ?MEDICATIONS & ALLERGIES:    ?Patient reports the following regarding taking any anticoagulation/antiplatelet therapy:   ?Plavix, Coumadin, Eliquis, Xarelto, Lovenox, Pradaxa, Brilinta, or Effient? yes (Lovenox) ?Aspirin? yes (81mg  daily) ? ?Patient confirms/reports the following medications:  ?Current Outpatient Medications  ?Medication Sig Dispense Refill  ? amLODipine-benazepril (LOTREL) 5-20 MG per capsule Take 1 capsule by mouth every morning.    ? amoxicillin (AMOXIL) 500 MG tablet Take 2,000 mg by mouth as needed. Prior to dental appointments-Take 4 tablets 1 hour prior to dental procedures    ? Calcium Carb-Cholecalciferol 600-800 MG-UNIT TABS Take 1 tablet by mouth daily.    ? calcium carbonate (TUMS - DOSED IN MG ELEMENTAL CALCIUM) 500 MG chewable tablet Chew 1 tablet by mouth as needed for indigestion or heartburn.    ? celecoxib (CELEBREX) 200 MG capsule Take 1 capsule (200 mg total) by mouth 2 (two) times daily. 90 capsule 0  ? cholecalciferol (VITAMIN D3) 25 MCG (1000 UNIT) tablet Take 1,000 Units by mouth daily.    ? enoxaparin (LOVENOX) 40 MG/0.4ML injection Inject 0.4 mLs (40 mg total) into the skin daily for 14 days. 5.6 mL 0  ? fish oil-omega-3 fatty acids 1000 MG capsule Take 1 g by mouth daily.    ? FLUoxetine (PROZAC) 20 MG  capsule Take 20 mg by mouth every morning.    ? HYDROcodone-acetaminophen (NORCO/VICODIN) 5-325 MG tablet Take 1 tablet by mouth every 4 (four) hours as needed for severe pain. 30 tablet 0  ? Multiple Vitamins-Minerals (HAIR SKIN AND NAILS FORMULA PO) Take 2 tablets by mouth daily.    ? Multiple Vitamins-Minerals (MULTIVITAMIN WITH MINERALS) tablet Take 1 tablet by mouth daily. Centrum Silver 50+    ? rosuvastatin (CRESTOR) 5 MG tablet Take 5 mg by mouth daily with supper.    ? traMADol (ULTRAM) 50 MG tablet Take 1 tablet (50 mg total) by mouth every 4 (four) hours as needed for moderate pain. 30 tablet 0  ? ?No current facility-administered medications for this visit.  ? ? ?Patient confirms/reports the following allergies:  ?Allergies  ?Allergen Reactions  ? Diclofenac Other (See Comments)  ?  Topical product cause vertigo  ? Pravastatin   ?  Muscle pain  ? ? ?No orders of the defined types were placed in this encounter. ? ? ?AUTHORIZATION INFORMATION ?Primary Insurance: ?1D#: ?Group #: ? ?Secondary Insurance: ?1D#: ?Group #: ? ?SCHEDULE INFORMATION: ?Date: 03/06/22 ?Time: ?Location: [facility] ?

## 2021-07-04 ENCOUNTER — Other Ambulatory Visit: Payer: Self-pay | Admitting: Nurse Practitioner

## 2021-07-04 DIAGNOSIS — Z1231 Encounter for screening mammogram for malignant neoplasm of breast: Secondary | ICD-10-CM

## 2021-08-07 ENCOUNTER — Ambulatory Visit
Admission: RE | Admit: 2021-08-07 | Discharge: 2021-08-07 | Disposition: A | Discharge status: Home / Self Care | Payer: Medicare Other | Source: Ambulatory Visit | Attending: Nurse Practitioner | Admitting: Nurse Practitioner

## 2021-08-07 DIAGNOSIS — Z1231 Encounter for screening mammogram for malignant neoplasm of breast: Secondary | ICD-10-CM | POA: Diagnosis present

## 2022-03-02 ENCOUNTER — Telehealth: Payer: Self-pay | Admitting: Gastroenterology

## 2022-03-02 NOTE — Telephone Encounter (Signed)
Patient called needing to cancel colonoscopy due to some pain in her back. She states she will call on a later date to reschedule.

## 2022-03-02 NOTE — Telephone Encounter (Signed)
Returned patients call to let her know that her message was received and procedure has been canceled with Dr. Young Berry for 03/06/22 at [facility]. Trish in Endo notified.  Thanks,  [place], [place]

## 2022-03-06 ENCOUNTER — Encounter: Admission: RE | Payer: Self-pay | Source: Ambulatory Visit

## 2022-03-06 ENCOUNTER — Ambulatory Visit: Admission: RE | Admit: 2022-03-06 | Payer: Medicare Other | Source: Ambulatory Visit | Admitting: Gastroenterology

## 2022-03-06 SURGERY — COLONOSCOPY WITH PROPOFOL
Anesthesia: General

## 2022-06-11 ENCOUNTER — Other Ambulatory Visit: Payer: Self-pay

## 2022-06-11 ENCOUNTER — Telehealth: Payer: Self-pay | Admitting: Gastroenterology

## 2022-06-11 ENCOUNTER — Telehealth: Payer: Self-pay

## 2022-06-11 DIAGNOSIS — Z860100 Personal history of colon polyps, unspecified: Secondary | ICD-10-CM

## 2022-06-11 DIAGNOSIS — Z800 Family history of malignant neoplasm of digestive organs: Secondary | ICD-10-CM

## 2022-06-11 NOTE — Telephone Encounter (Signed)
Gastroenterology Pre-Procedure Review  Request Date: 07/03/22 Requesting Physician: Dr. Allen Norris  Patient experiences nausea associated with anesthesia.  PATIENT REVIEW QUESTIONS: The patient responded to the following health history questions as indicated:    1. Are you having any GI issues? no 2. Do you have a personal history of Polyps? yes (last colonoscopy 2019 Dr.Elliott polyps were noted) 3. Do you have a family history of Colon Cancer or Polyps? yes (sister died in her 87tys of colorectal cancer) 4. Diabetes Mellitus? no 5. Joint replacements in the past 12 months?no 6. Major health problems in the past 3 months?no 7. Any artificial heart valves, MVP, or defibrillator?no    MEDICATIONS & ALLERGIES:    Patient reports the following regarding taking any anticoagulation/antiplatelet therapy:   Plavix, Coumadin, Eliquis, Xarelto, Lovenox, Pradaxa, Brilinta, or Effient? no Aspirin? no  Patient confirms/reports the following medications:  Current Outpatient Medications  Medication Sig Dispense Refill   amLODipine-benazepril (LOTREL) 5-20 MG per capsule Take 1 capsule by mouth every morning.     amoxicillin (AMOXIL) 500 MG tablet Take 2,000 mg by mouth as needed. Prior to dental appointments-Take 4 tablets 1 hour prior to dental procedures     Calcium Carb-Cholecalciferol 600-800 MG-UNIT TABS Take 1 tablet by mouth daily.     calcium carbonate (TUMS - DOSED IN MG ELEMENTAL CALCIUM) 500 MG chewable tablet Chew 1 tablet by mouth as needed for indigestion or heartburn.     celecoxib (CELEBREX) 200 MG capsule Take 1 capsule (200 mg total) by mouth 2 (two) times daily. 90 capsule 0   cholecalciferol (VITAMIN D3) 25 MCG (1000 UNIT) tablet Take 1,000 Units by mouth daily.     enoxaparin (LOVENOX) 40 MG/0.4ML injection Inject 0.4 mLs (40 mg total) into the skin daily for 14 days. 5.6 mL 0   fish oil-omega-3 fatty acids 1000 MG capsule Take 1 g by mouth daily.     FLUoxetine (PROZAC) 20 MG  capsule Take 20 mg by mouth every morning.     HYDROcodone-acetaminophen (NORCO/VICODIN) 5-325 MG tablet Take 1 tablet by mouth every 4 (four) hours as needed for severe pain. 30 tablet 0   Multiple Vitamins-Minerals (HAIR SKIN AND NAILS FORMULA PO) Take 2 tablets by mouth daily.     Multiple Vitamins-Minerals (MULTIVITAMIN WITH MINERALS) tablet Take 1 tablet by mouth daily. Centrum Silver 50+     rosuvastatin (CRESTOR) 5 MG tablet Take 5 mg by mouth daily with supper.     traMADol (ULTRAM) 50 MG tablet Take 1 tablet (50 mg total) by mouth every 4 (four) hours as needed for moderate pain. 30 tablet 0   No current facility-administered medications for this visit.    Patient confirms/reports the following allergies:  Allergies  Allergen Reactions   Diclofenac Other (See Comments)    Topical product cause vertigo   Pravastatin     Muscle pain    No orders of the defined types were placed in this encounter.   AUTHORIZATION INFORMATION Primary Insurance: 1D#: Group #:  Secondary Insurance: 1D#: Group #:  SCHEDULE INFORMATION: Date:  Time: Location:

## 2022-06-11 NOTE — Telephone Encounter (Signed)
Patient calling to schedule colonoscopy.  

## 2022-07-02 ENCOUNTER — Encounter: Payer: Self-pay | Admitting: Gastroenterology

## 2022-07-03 ENCOUNTER — Ambulatory Visit: Payer: Medicare Other | Admitting: Certified Registered"

## 2022-07-03 ENCOUNTER — Encounter
Admission: RE | Disposition: A | Discharge status: Home / Self Care | Payer: Self-pay | Source: Home / Self Care | Attending: Gastroenterology

## 2022-07-03 ENCOUNTER — Encounter: Payer: Self-pay | Admitting: Gastroenterology

## 2022-07-03 ENCOUNTER — Ambulatory Visit
Admission: RE | Admit: 2022-07-03 | Discharge: 2022-07-03 | Disposition: A | Discharge status: Home / Self Care | Payer: Medicare Other | Attending: Gastroenterology | Admitting: Gastroenterology

## 2022-07-03 DIAGNOSIS — Z6832 Body mass index (BMI) 32.0-32.9, adult: Secondary | ICD-10-CM | POA: Insufficient documentation

## 2022-07-03 DIAGNOSIS — Z1211 Encounter for screening for malignant neoplasm of colon: Secondary | ICD-10-CM | POA: Insufficient documentation

## 2022-07-03 DIAGNOSIS — Z860100 Personal history of colon polyps, unspecified: Secondary | ICD-10-CM

## 2022-07-03 DIAGNOSIS — K5730 Diverticulosis of large intestine without perforation or abscess without bleeding: Secondary | ICD-10-CM | POA: Insufficient documentation

## 2022-07-03 DIAGNOSIS — K640 First degree hemorrhoids: Secondary | ICD-10-CM | POA: Insufficient documentation

## 2022-07-03 DIAGNOSIS — K635 Polyp of colon: Secondary | ICD-10-CM

## 2022-07-03 DIAGNOSIS — Z90710 Acquired absence of both cervix and uterus: Secondary | ICD-10-CM | POA: Diagnosis not present

## 2022-07-03 DIAGNOSIS — K219 Gastro-esophageal reflux disease without esophagitis: Secondary | ICD-10-CM | POA: Insufficient documentation

## 2022-07-03 DIAGNOSIS — E785 Hyperlipidemia, unspecified: Secondary | ICD-10-CM | POA: Diagnosis not present

## 2022-07-03 DIAGNOSIS — I10 Essential (primary) hypertension: Secondary | ICD-10-CM | POA: Diagnosis not present

## 2022-07-03 DIAGNOSIS — D122 Benign neoplasm of ascending colon: Secondary | ICD-10-CM | POA: Diagnosis not present

## 2022-07-03 DIAGNOSIS — Z803 Family history of malignant neoplasm of breast: Secondary | ICD-10-CM | POA: Insufficient documentation

## 2022-07-03 DIAGNOSIS — Z800 Family history of malignant neoplasm of digestive organs: Secondary | ICD-10-CM

## 2022-07-03 DIAGNOSIS — E669 Obesity, unspecified: Secondary | ICD-10-CM | POA: Diagnosis not present

## 2022-07-03 HISTORY — PX: COLONOSCOPY WITH PROPOFOL: SHX5780

## 2022-07-03 SURGERY — COLONOSCOPY WITH PROPOFOL
Anesthesia: General

## 2022-07-03 MED ORDER — PROPOFOL 10 MG/ML IV BOLUS
INTRAVENOUS | Status: AC
  Filled 2022-07-03: qty 20

## 2022-07-03 MED ORDER — PROPOFOL 1000 MG/100ML IV EMUL
INTRAVENOUS | Status: AC
  Filled 2022-07-03: qty 100

## 2022-07-03 MED ORDER — PROPOFOL 500 MG/50ML IV EMUL
INTRAVENOUS | Status: DC | PRN
  Administered 2022-07-03: 145 ug/kg/min via INTRAVENOUS

## 2022-07-03 MED ORDER — SODIUM CHLORIDE 0.9 % IV SOLN: INTRAVENOUS | Status: DC

## 2022-07-03 NOTE — H&P (Signed)
Midge Minium, MD [facility] [address] [place], [place] [address] Phone:[phone] Fax : [phone]  Primary Care Physician:  Myrene Buddy, NP Primary Gastroenterologist:  Dr. Servando Snare  Pre-Procedure History & Physical: HPI:  Tina Petty is a 72 y.o. female is here for an colonoscopy.   Past Medical History:  Diagnosis Date   Anxiety    Arthritis    Depression    GERD (gastroesophageal reflux disease)    occ   Hx of dizziness    Hyperlipidemia    Hypertension    PONV (postoperative nausea and vomiting)    not recently   Spinal stenosis of lumbar region with neurogenic claudication     Past Surgical History:  Procedure Laterality Date   ABDOMINAL HYSTERECTOMY     adenomatours colon polyp     BREAST BIOPSY Right 1990   neg- papiloma   BREAST LUMPECTOMY Right    papilloma   carpel tunnel surgery Left    CESAREAN SECTION     x2   chronic back pain unspecified     severe multilevel spondylitic stenosis   COLONOSCOPY WITH PROPOFOL N/A 06/05/2017   Procedure: COLONOSCOPY WITH PROPOFOL;  Surgeon: Scot Jun, MD;  Location: [facility] ENDOSCOPY;  Service: Endoscopy;  Laterality: N/A;   KNEE ARTHROPLASTY Left 03/16/2020   Procedure: COMPUTER ASSISTED TOTAL KNEE ARTHROPLASTY;  Surgeon: Donato Heinz, MD;  Location: [facility] ORS;  Service: Orthopedics;  Laterality: Left;   KNEE SURGERY Left 1976   cart. removed   LUMBAR LAMINECTOMY/DECOMPRESSION MICRODISCECTOMY Bilateral 05/30/2012   Procedure: LUMBAR LAMINECTOMY/DECOMPRESSION MICRODISCECTOMY 3 LEVELS;  Surgeon: Barnett Abu, MD;  Location: [facility] NEURO ORS;  Service: Neurosurgery;  Laterality: Bilateral;  Bilateral Lumbar two-three,three-four,four-five Laminectomy/Foraminotomy   MANDIBLE SURGERY     OOPHORECTOMY     TOTAL HIP ARTHROPLASTY Left 06/14/2021   Procedure: TOTAL HIP ARTHROPLASTY;  Surgeon: Donato Heinz, MD;  Location: [facility] ORS;  Service: Orthopedics;  Laterality: Left;   TRIGGER FINGER RELEASE  Left    thumb    Prior to Admission medications   Medication Sig Start Date End Date Taking? Authorizing Provider  amLODipine-benazepril (LOTREL) 5-20 MG per capsule Take 1 capsule by mouth every morning.   Yes [provider]  celecoxib (CELEBREX) 200 MG capsule Take 1 capsule (200 mg total) by mouth 2 (two) times daily. 06/15/21  Yes Lasandra Beech B, PA-C  fish oil-omega-3 fatty acids 1000 MG capsule Take 1 g by mouth daily.   Yes [provider]  Multiple Vitamins-Minerals (MULTIVITAMIN WITH MINERALS) tablet Take 1 tablet by mouth daily. Centrum Silver 50+   Yes [provider]  amoxicillin (AMOXIL) 500 MG tablet Take 2,000 mg by mouth as needed. Prior to dental appointments-Take 4 tablets 1 hour prior to dental procedures    [provider]  Calcium Carb-Cholecalciferol 600-800 MG-UNIT TABS Take 1 tablet by mouth daily.    [provider]  calcium carbonate (TUMS - DOSED IN MG ELEMENTAL CALCIUM) 500 MG chewable tablet Chew 1 tablet by mouth as needed for indigestion or heartburn.    [provider]  cholecalciferol (VITAMIN D3) 25 MCG (1000 UNIT) tablet Take 1,000 Units by mouth daily.    [provider]  enoxaparin (LOVENOX) 40 MG/0.4ML injection Inject 0.4 mLs (40 mg total) into the skin daily for 14 days. 06/15/21 06/29/21  Madelyn Flavors, PA-C  FLUoxetine (PROZAC) 20 MG capsule Take 20 mg by mouth every morning.    [provider]  Multiple Vitamins-Minerals (  HAIR SKIN AND NAILS FORMULA PO) Take 2 tablets by mouth daily.    [provider]  rosuvastatin (CRESTOR) 5 MG tablet Take 5 mg by mouth daily with supper. 01/08/20   [provider]  traMADol (ULTRAM) 50 MG tablet Take 1 tablet (50 mg total) by mouth every 4 (four) hours as needed for moderate pain. Patient not taking: Reported on 07/03/2022 06/15/21   Lasandra Beech B, PA-C    Allergies as of 06/11/2022 - Review Complete 06/22/2021   Allergen Reaction Noted   Diclofenac Other (See Comments) 06/09/2021   Pravastatin  06/04/2017    Family History  Problem Relation Age of Onset   Breast cancer Cousin        1st cousin paternal side    Social History   Socioeconomic History   Marital status: Widowed    Spouse name: Not on file   Number of children: Not on file   Years of education: Not on file   Highest education level: Not on file  Occupational History   Not on file  Tobacco Use   Smoking status: Never   Smokeless tobacco: Never  Vaping Use   Vaping Use: Never used  Substance and Sexual Activity   Alcohol use: No   Drug use: No   Sexual activity: Not on file  Other Topics Concern   Not on file  Social History Narrative   Not on file   Social Determinants of Health   Financial Resource Strain: Not on file  Food Insecurity: Not on file  Transportation Needs: Not on file  Physical Activity: Not on file  Stress: Not on file  Social Connections: Not on file  Intimate Partner Violence: Not on file    Review of Systems: See HPI, otherwise negative ROS  Physical Exam: BP 137/67   Pulse 77   Temp (!) 97.5 F (36.4 C) (Temporal)   Resp 16   Ht  (1.676 m)   Wt 90.7 kg   SpO2 96%   BMI 32.28 kg/m  General:   Alert,  pleasant and cooperative in NAD Head:  Normocephalic and atraumatic. Neck:  Supple; no masses or thyromegaly. Lungs:  Clear throughout to auscultation.    Heart:  Regular rate and rhythm. Abdomen:  Soft, nontender and nondistended. Normal bowel sounds, without guarding, and without rebound.   Neurologic:  Alert and  oriented x4;  grossly normal neurologically.  Impression/Plan: Tina Petty is here for an colonoscopy to be performed for screening for family hisotory of colon cancer  Risks, benefits, limitations, and alternatives regarding  colonoscopy have been reviewed with the patient.  Questions have been answered.  All parties agreeable.   Midge Minium, MD   07/03/2022, 10:03 AM

## 2022-07-03 NOTE — Anesthesia Postprocedure Evaluation (Signed)
Anesthesia Post Note  Patient: Tina Petty  Procedure(s) Performed: COLONOSCOPY WITH PROPOFOL  Patient location during evaluation: Endoscopy Anesthesia Type: General Level of consciousness: awake and alert Pain management: pain level controlled Vital Signs Assessment: post-procedure vital signs reviewed and stable Respiratory status: spontaneous breathing, nonlabored ventilation, respiratory function stable and patient connected to nasal cannula oxygen Cardiovascular status: blood pressure returned to baseline and stable Postop Assessment: no apparent nausea or vomiting Anesthetic complications: no   No notable events documented.   Last Vitals:  Vitals:   07/03/22 1040 07/03/22 1050  BP: 135/64   Pulse:    Resp:    Temp:    SpO2:  99%    Last Pain:  Vitals:   07/03/22 1050  TempSrc:   PainSc: 0-No pain                 Lenard Simmer

## 2022-07-03 NOTE — Anesthesia Preprocedure Evaluation (Signed)
Anesthesia Evaluation  Patient identified by MRN, date of birth, ID band Patient awake    Reviewed: Allergy & Precautions, H&P , NPO status , Patient's Chart, lab work & pertinent test results, reviewed documented beta blocker date and time   History of Anesthesia Complications (+) PONV and history of anesthetic complications  Airway Mallampati: II   Neck ROM: full    Dental  (+) Teeth Intact   Pulmonary neg pulmonary ROS   Pulmonary exam normal        Cardiovascular Exercise Tolerance: Poor hypertension, On Medications (-) angina (-) Past MI and (-) Cardiac Stents Normal cardiovascular exam(-) dysrhythmias (-) Valvular Problems/Murmurs Rhythm:regular Rate:Normal     Neuro/Psych  PSYCHIATRIC DISORDERS Anxiety Depression    Spinal stenosis of lumbar region with neurogenic claudication    GI/Hepatic Neg liver ROS,GERD  ,,  Endo/Other  negative endocrine ROS    Renal/GU negative Renal ROS  negative genitourinary   Musculoskeletal   Abdominal  (+) + obese  Peds  Hematology negative hematology ROS (+)   Anesthesia Other Findings Past Medical History: No date: Arthritis No date: Depression No date: Hx of dizziness No date: Hyperlipidemia No date: Hypertension No date: PONV (postoperative nausea and vomiting)     Comment:  not recently Past Surgical History: No date: ABDOMINAL HYSTERECTOMY No date: adenomatours colon polyp 1990: BREAST BIOPSY; Right     Comment:  neg- papiloma No date: BREAST LUMPECTOMY; Right     Comment:  papilloma No date: carpel tunnel surgery; Left No date: CESAREAN SECTION No date: chronic back pain unspecified     Comment:  severe multilevel spondylitic stenosis 06/05/2017: COLONOSCOPY WITH PROPOFOL; N/A     Comment:  Procedure: COLONOSCOPY WITH PROPOFOL;  Surgeon: Scot Jun, MD;  Location: [facility] ENDOSCOPY;  Service:               Endoscopy;  Laterality: N/A; No  date: KNEE SURGERY; Left     Comment:  cart. removed 05/30/2012: LUMBAR LAMINECTOMY/DECOMPRESSION MICRODISCECTOMY; Bilateral     Comment:  Procedure: LUMBAR LAMINECTOMY/DECOMPRESSION               MICRODISCECTOMY 3 LEVELS;  Surgeon: Barnett Abu, MD;                Location: [facility] NEURO ORS;  Service: Neurosurgery;                Laterality: Bilateral;  Bilateral Lumbar               two-three,three-four,four-five Laminectomy/Foraminotomy No date: MANDIBLE SURGERY No date: OOPHORECTOMY No date: TRIGGER FINGER RELEASE; Left     Comment:  thumb   Reproductive/Obstetrics negative OB ROS                             Anesthesia Physical Anesthesia Plan  ASA: 2  Anesthesia Plan: General   Post-op Pain Management:    Induction: Intravenous  PONV Risk Score and Plan: 4 or greater and Propofol infusion and TIVA  Airway Management Planned: Natural Airway and Nasal Cannula  Additional Equipment:   Intra-op Plan:   Post-operative Plan:   Informed Consent: I have reviewed the patients History and Physical, chart, labs and discussed the procedure including the risks, benefits and alternatives for the proposed anesthesia with the patient or authorized representative who has indicated his/her understanding and acceptance.  Dental advisory given  Plan Discussed with: CRNA and Anesthesiologist  Anesthesia Plan Comments:         Anesthesia Quick Evaluation

## 2022-07-03 NOTE — Op Note (Signed)
[facility] Gastroenterology Patient Name: Tina Petty Procedure Date: 07/03/2022 10:03 AM MRN: [id number] Account #: [id number] Date of Birth: Aug 04, 1950 Admit Type: Outpatient Age: 72 Room: [facility] ENDO ROOM 3 Gender: Female Note Status: Finalized Instrument Name: [name] [id number] Procedure:             Colonoscopy Indications:           Screening in patient at increased risk: Colorectal                         cancer in sister before age 71 Providers:             Midge Minium MD, MD Referring MD:          Midge Minium MD, MD (Referring MD) Medicines:             Propofol per Anesthesia Complications:         No immediate complications. Procedure:             Pre-Anesthesia Assessment:                        - Prior to the procedure, a History and Physical was                         performed, and patient medications and allergies were                         reviewed. The patient's tolerance of previous                         anesthesia was also reviewed. The risks and benefits                         of the procedure and the sedation options and risks                         were discussed with the patient. All questions were                         answered, and informed consent was obtained. Prior                         Anticoagulants: The patient has taken no anticoagulant                         or antiplatelet agents. ASA Grade Assessment: II - A                         patient with mild systemic disease. After reviewing                         the risks and benefits, the patient was deemed in                         satisfactory condition to undergo the procedure.                        After obtaining informed consent, the colonoscope was  passed under direct vision. Throughout the procedure,                         the patient's blood pressure, pulse, and oxygen                         saturations were monitored continuously.  The                         Colonoscope was introduced through the anus and                         advanced to the the cecum, identified by appendiceal                         orifice and ileocecal valve. The colonoscopy was                         performed without difficulty. The patient tolerated                         the procedure well. The quality of the bowel                         preparation was good. Findings:      The perianal and digital rectal examinations were normal.      A 4 mm polyp was found in the ascending colon. The polyp was sessile.       The polyp was removed with a cold snare. Resection and retrieval were       complete.      Non-bleeding internal hemorrhoids were found during retroflexion. The       hemorrhoids were Grade I (internal hemorrhoids that do not prolapse).      Multiple small-mouthed diverticula were found in the sigmoid colon and       descending colon. Impression:            - One 4 mm polyp in the ascending colon, removed with                         a cold snare. Resected and retrieved.                        - Non-bleeding internal hemorrhoids.                        - Diverticulosis in the sigmoid colon and in the                         descending colon. Recommendation:        - Discharge patient to home.                        - Resume previous diet.                        - Continue present medications.                        - Await pathology results.                        -  Repeat colonoscopy in 5 years for surveillance. Procedure Code(s):     --- Professional ---                        [phone], Colonoscopy, flexible; with removal of                         tumor(s), polyp(s), or other lesion(s) by snare                         technique Diagnosis Code(s):     --- Professional ---                        D12.2, Benign neoplasm of ascending colon CPT copyright 2022 [employer]. All rights reserved. The codes documented  in this report are preliminary and upon coder review may  be revised to meet current compliance requirements. Midge Minium MD, MD 07/03/2022 10:30:09 AM This report has been signed electronically. Number of Addenda: 0 Note Initiated On: 07/03/2022 10:03 AM Scope Withdrawal Time: 0 hours 6 minutes 40 seconds  Total Procedure Duration: 0 hours 10 minutes 27 seconds  Estimated Blood Loss:  Estimated blood loss: none.      [facility]

## 2022-07-03 NOTE — Transfer of Care (Signed)
Immediate Anesthesia Transfer of Care Note  Patient: Tina Petty  Procedure(s) Performed: COLONOSCOPY WITH PROPOFOL  Patient Location: PACU  Anesthesia Type:General  Level of Consciousness: awake, alert , and oriented  Airway & Oxygen Therapy: Patient Spontanous Breathing and Patient connected to nasal cannula oxygen  Post-op Assessment: Report given to RN and Post -op Vital signs reviewed and stable  Post vital signs: Reviewed and stable  Last Vitals:  Vitals Value Taken Time  BP 107/45 07/03/22 1030  Temp 36.1 C 07/03/22 1030  Pulse 76 07/03/22 1030  Resp 17 07/03/22 1030  SpO2 96 % 07/03/22 1030  Vitals shown include unvalidated device data.  Last Pain:  Vitals:   07/03/22 1030  TempSrc: Temporal  PainSc: Asleep         Complications: No notable events documented.

## 2022-07-04 ENCOUNTER — Encounter: Payer: Self-pay | Admitting: Gastroenterology

## 2022-07-04 LAB — SURGICAL PATHOLOGY

## 2022-07-05 ENCOUNTER — Other Ambulatory Visit: Payer: Self-pay | Admitting: Nurse Practitioner

## 2022-07-05 DIAGNOSIS — Z1231 Encounter for screening mammogram for malignant neoplasm of breast: Secondary | ICD-10-CM

## 2022-08-15 ENCOUNTER — Ambulatory Visit
Admission: RE | Admit: 2022-08-15 | Discharge: 2022-08-15 | Disposition: A | Discharge status: Home / Self Care | Payer: Medicare Other | Source: Ambulatory Visit | Attending: Nurse Practitioner | Admitting: Nurse Practitioner

## 2022-08-15 DIAGNOSIS — Z1231 Encounter for screening mammogram for malignant neoplasm of breast: Secondary | ICD-10-CM | POA: Insufficient documentation

## 2023-07-18 ENCOUNTER — Other Ambulatory Visit: Payer: Self-pay | Admitting: Nurse Practitioner

## 2023-07-18 DIAGNOSIS — Z1231 Encounter for screening mammogram for malignant neoplasm of breast: Secondary | ICD-10-CM

## 2023-08-16 ENCOUNTER — Encounter

## 2023-08-19 ENCOUNTER — Encounter
